# Patient Record
Sex: Female | Born: 1970 | Race: Black or African American | Hispanic: No | Marital: Married | Smoking: Never smoker
Health system: Southern US, Community
[De-identification: ages and names within clinical notes are randomized; demographics above are authoritative.]

## PROBLEM LIST (undated history)

## (undated) DIAGNOSIS — N182 Chronic kidney disease, stage 2 (mild): Secondary | ICD-10-CM

## (undated) DIAGNOSIS — O009 Unspecified ectopic pregnancy without intrauterine pregnancy: Secondary | ICD-10-CM

## (undated) DIAGNOSIS — F419 Anxiety disorder, unspecified: Secondary | ICD-10-CM

## (undated) DIAGNOSIS — S83252A Bucket-handle tear of lateral meniscus, current injury, left knee, initial encounter: Secondary | ICD-10-CM

## (undated) DIAGNOSIS — D219 Benign neoplasm of connective and other soft tissue, unspecified: Secondary | ICD-10-CM

## (undated) HISTORY — PX: MYOMECTOMY: SHX85

## (undated) HISTORY — DX: Anxiety disorder, unspecified: F41.9

---

## 1994-10-29 HISTORY — PX: TONSILLECTOMY AND ADENOIDECTOMY: SUR1326

## 2012-02-04 ENCOUNTER — Encounter: Payer: Self-pay | Admitting: Internal Medicine

## 2012-02-04 ENCOUNTER — Ambulatory Visit (INDEPENDENT_AMBULATORY_CARE_PROVIDER_SITE_OTHER): Payer: 59 | Admitting: Internal Medicine

## 2012-02-04 VITALS — BP 126/80 | HR 80 | Temp 97.9°F | Ht 64.0 in | Wt 178.0 lb

## 2012-02-04 DIAGNOSIS — F411 Generalized anxiety disorder: Secondary | ICD-10-CM

## 2012-02-04 DIAGNOSIS — E049 Nontoxic goiter, unspecified: Secondary | ICD-10-CM

## 2012-02-04 DIAGNOSIS — F419 Anxiety disorder, unspecified: Secondary | ICD-10-CM | POA: Insufficient documentation

## 2012-02-04 DIAGNOSIS — E010 Iodine-deficiency related diffuse (endemic) goiter: Secondary | ICD-10-CM

## 2012-02-04 HISTORY — DX: Anxiety disorder, unspecified: F41.9

## 2012-02-04 MED ORDER — CITALOPRAM HYDROBROMIDE 20 MG PO TABS: 20.0000 mg | ORAL_TABLET | Freq: Every day | ORAL | Status: DC

## 2012-02-04 NOTE — Assessment & Plan Note (Signed)
Mild thyromegaly on exam today, patient was never been told before. Plan: Recheck or return to the office, likely will need labs

## 2012-02-04 NOTE — Progress Notes (Signed)
  Subjective:    Patient ID: Theresa Kidd, female    DOB: 07-10-1971, 41 y.o.   MRN: [id number]  HPI New  patient Here to establish care, also going through a lot in the last few months, just moved to this area, working full time, getting a PhD. In the past she had anxiety and was well controlled with citalopram 10 mg daily. Wonders if that's a good option.   Past medical history Anxiety  Past surgical history tonsillectomy & adenoidectomy at age 42 G4P2 Not doing birth control   Social history Married, 2 children Moved from [place]  Occupation: Publishing rights manager , getting a PhD Works at the [facility] [employer] Tobacco-- no ETOH-- no  Family history Diabetes-- uncle CAD-- F MI at age 31 Stroke-- F  Several, first at age 20 Colon cancer-- no Breast cancer-- no Prostate cancer-- no   Review of Systems Very mild depression per se. Not sleeping well, she is worried about things but at the same time she is very busy and doesn't have enough time to rest. Fortunately, her 2 daughters are doing well and adaptoing quickly to the new environment.     Objective:   Physical Exam   General -- alert, well-developed, and well-nourished. NAD  Neck --no LADs, thyroid slightly enlarged?, Not tender, nodular. Gland is symmetric Lungs -- normal respiratory effort, no intercostal retractions, no accessory muscle use, and normal breath sounds.   Heart-- normal rate, regular rhythm, no murmur, and no gallop.   Extremities-- no pretibial edema bilaterally  Psych-- oriented x3, no apparent emotional distress     Assessment & Plan:   Last PAP 01-2011 Last MMG 2012 Never had a cscope  Gave her some options about a gynecologist, see instructions

## 2012-02-04 NOTE — Assessment & Plan Note (Addendum)
Anxiety with minimal depression. In the past she responded well to citalopram 10 mg. Patient is counseled briefly today. I recommend citalopram, to stay on 10 mg daily if that dose is enough otherwise she could go up to 20 mg. See instructions.

## 2012-02-04 NOTE — Patient Instructions (Signed)
Take half citalopram daily, increase to one of the needed. Please come back in 6 weeks for a checkup and call if you have side effects. ------------------------------ [facility]  [address] [place], [place] [address]  Location Hours:Monday - Friday: 7:45am - 5:00pm Telephone answered Monday - Friday: 8:00am - 5:00pm  Phone:[phone]  [phone]

## 2012-02-05 ENCOUNTER — Encounter: Payer: Self-pay | Admitting: Internal Medicine

## 2012-03-04 ENCOUNTER — Ambulatory Visit: Payer: Self-pay | Admitting: Family Medicine

## 2012-04-15 ENCOUNTER — Other Ambulatory Visit: Payer: Self-pay | Admitting: Internal Medicine

## 2012-04-15 NOTE — Telephone Encounter (Signed)
Refill done.  

## 2012-05-12 ENCOUNTER — Other Ambulatory Visit: Payer: Self-pay | Admitting: Internal Medicine

## 2012-06-06 ENCOUNTER — Other Ambulatory Visit: Payer: Self-pay | Admitting: Obstetrics and Gynecology

## 2012-06-06 DIAGNOSIS — Z1231 Encounter for screening mammogram for malignant neoplasm of breast: Secondary | ICD-10-CM

## 2012-06-19 ENCOUNTER — Ambulatory Visit: Payer: 59

## 2012-06-20 ENCOUNTER — Other Ambulatory Visit (HOSPITAL_COMMUNITY): Payer: Self-pay | Admitting: Obstetrics and Gynecology

## 2012-06-20 DIAGNOSIS — N96 Recurrent pregnancy loss: Secondary | ICD-10-CM

## 2012-06-26 ENCOUNTER — Ambulatory Visit (HOSPITAL_COMMUNITY)
Admission: RE | Admit: 2012-06-26 | Discharge: 2012-06-26 | Disposition: A | Discharge status: Home / Self Care | Payer: 59 | Source: Ambulatory Visit | Attending: Obstetrics and Gynecology | Admitting: Obstetrics and Gynecology

## 2012-06-26 DIAGNOSIS — N96 Recurrent pregnancy loss: Secondary | ICD-10-CM | POA: Insufficient documentation

## 2012-06-26 MED ORDER — IOHEXOL 300 MG/ML  SOLN
50.0000 mL | Freq: Once | INTRAMUSCULAR | Status: AC | PRN
  Administered 2012-06-26: 50 mL

## 2012-07-01 ENCOUNTER — Encounter (HOSPITAL_COMMUNITY): Payer: Self-pay | Admitting: Family Medicine

## 2012-07-01 ENCOUNTER — Emergency Department (HOSPITAL_COMMUNITY)
Admission: EM | Admit: 2012-07-01 | Discharge: 2012-07-02 | Disposition: A | Discharge status: Home / Self Care | Payer: 59 | Attending: Emergency Medicine | Admitting: Emergency Medicine

## 2012-07-01 ENCOUNTER — Emergency Department (HOSPITAL_COMMUNITY): Payer: 59

## 2012-07-01 ENCOUNTER — Ambulatory Visit
Admission: RE | Admit: 2012-07-01 | Discharge: 2012-07-01 | Disposition: A | Discharge status: Home / Self Care | Payer: 59 | Source: Ambulatory Visit | Attending: Obstetrics and Gynecology | Admitting: Obstetrics and Gynecology

## 2012-07-01 DIAGNOSIS — R109 Unspecified abdominal pain: Secondary | ICD-10-CM | POA: Insufficient documentation

## 2012-07-01 DIAGNOSIS — O99891 Other specified diseases and conditions complicating pregnancy: Secondary | ICD-10-CM | POA: Insufficient documentation

## 2012-07-01 DIAGNOSIS — Z1231 Encounter for screening mammogram for malignant neoplasm of breast: Secondary | ICD-10-CM

## 2012-07-01 DIAGNOSIS — N200 Calculus of kidney: Secondary | ICD-10-CM | POA: Insufficient documentation

## 2012-07-01 DIAGNOSIS — Z3490 Encounter for supervision of normal pregnancy, unspecified, unspecified trimester: Secondary | ICD-10-CM

## 2012-07-01 DIAGNOSIS — O210 Mild hyperemesis gravidarum: Secondary | ICD-10-CM | POA: Insufficient documentation

## 2012-07-01 HISTORY — DX: Benign neoplasm of connective and other soft tissue, unspecified: D21.9

## 2012-07-01 HISTORY — DX: Unspecified ectopic pregnancy without intrauterine pregnancy: O00.90

## 2012-07-01 LAB — URINALYSIS, ROUTINE W REFLEX MICROSCOPIC
Glucose, UA: NEGATIVE mg/dL
Ketones, ur: NEGATIVE mg/dL
Leukocytes, UA: NEGATIVE
Nitrite: NEGATIVE
Specific Gravity, Urine: 1.024 (ref 1.005–1.030)
pH: 6.5 (ref 5.0–8.0)

## 2012-07-01 LAB — PREGNANCY, URINE: Preg Test, Ur: POSITIVE — AB

## 2012-07-01 LAB — CBC WITH DIFFERENTIAL/PLATELET
Basophils Relative: 0 % (ref 0–1)
Eosinophils Absolute: 0.1 10*3/uL (ref 0.0–0.7)
Eosinophils Relative: 1 % (ref 0–5)
Lymphs Abs: 2.9 10*3/uL (ref 0.7–4.0)
MCH: 30.1 pg (ref 26.0–34.0)
MCHC: 34.5 g/dL (ref 30.0–36.0)
MCV: 87.1 fL (ref 78.0–100.0)
Neutrophils Relative %: 43 % (ref 43–77)
Platelets: 229 10*3/uL (ref 150–400)
RDW: 13.1 % (ref 11.5–15.5)

## 2012-07-01 LAB — COMPREHENSIVE METABOLIC PANEL
ALT: 13 U/L (ref 0–35)
AST: 17 U/L (ref 0–37)
Albumin: 4.1 g/dL (ref 3.5–5.2)
Alkaline Phosphatase: 65 U/L (ref 39–117)
CO2: 23 mEq/L (ref 19–32)
Chloride: 103 mEq/L (ref 96–112)
Creatinine, Ser: 0.91 mg/dL (ref 0.50–1.10)
GFR calc non Af Amer: 77 mL/min — ABNORMAL LOW (ref >90)
Potassium: 4 mEq/L (ref 3.5–5.1)
Sodium: 136 mEq/L (ref 135–145)
Total Bilirubin: 0.3 mg/dL (ref 0.3–1.2)

## 2012-07-01 LAB — HCG, QUANTITATIVE, PREGNANCY: hCG, Beta Chain, Quant, S: 15 m[IU]/mL — ABNORMAL HIGH (ref <5)

## 2012-07-01 MED ORDER — ACETAMINOPHEN 325 MG PO TABS
650.0000 mg | ORAL_TABLET | Freq: Once | ORAL | Status: AC
  Administered 2012-07-01: 650 mg via ORAL
  Filled 2012-07-01: qty 2

## 2012-07-01 MED ORDER — ONDANSETRON HCL 4 MG/2ML IJ SOLN
4.0000 mg | Freq: Once | INTRAMUSCULAR | Status: DC
  Filled 2012-07-01: qty 2

## 2012-07-01 NOTE — ED Notes (Signed)
Pt reports lower abdominal pain starting this morning and getting worse. Reports nausea no vomiting. Denies diarrhea or urinary sx.

## 2012-07-01 NOTE — ED Notes (Signed)
Pt states she started having left lower quadrant pain since this AM. No dysuria. No oliguria. Pt stated having 2 ectopic pregnancies in the past and wants confirmation this is not the same. Pain left lower quadrant level 8. Pain has gotten progressively worse over the day. Vital signs stable.

## 2012-07-02 NOTE — ED Provider Notes (Signed)
History     CSN: [id number]  Arrival date & time 07/01/12  4098   First MD Initiated Contact with Patient 07/01/12 2026      Chief Complaint  Patient presents with  . Flank Pain  . Nausea    (Consider location/radiation/quality/duration/timing/severity/associated sxs/prior treatment) HPI  Patient presents to the emergency department for left lower quadrant pain starting this morning. She denies having any diarrhea or vomiting but does admit to some nausea. She has a history of ectopic pregnancy and miscarriage. She had a  Historgram done on Jun 26, 2012 and does not believe herself to be pregnant. She has been having light vaginal bleeding since the procedure and denies todays bleeding being any different. She has a history of kidney stones and feels as though this feels the same. She is in no acute distress and declines pain medications at this time.  Past Medical History  Diagnosis Date  . Fibroids   . Ectopic pregnancy     Past Surgical History  Procedure Date  . Tonsillectomy and adenoidectomy 1996    Family History  Problem Relation Age of Onset  . Heart disease Mother   . Stroke Mother   . Hypertension Mother   . Heart disease Father   . Stroke Father   . Hypertension Father   . Diabetes Maternal Uncle     History  Substance Use Topics  . Smoking status: Never Smoker   . Smokeless tobacco: Never Used  . Alcohol Use: No    OB History    Grav Para Term Preterm Abortions TAB SAB Ect Mult Living                  Review of Systems   Review of Systems  Gen: no weight loss, fevers, chills, night sweats  Eyes: no discharge or drainage, no occular pain or visual changes  Nose: no epistaxis or rhinorrhea  Mouth: no dental pain, no sore throat  Neck: no neck pain  Lungs:No wheezing, coughing or hemoptysis CV: no chest pain, palpitations, dependent edema or orthopnea  Abd: + left flank pain, nausea, vomiting  GU: no dysuria or gross hematuria  MSK:  No  abnormalities  Neuro: no headache, no focal neurologic deficits  Skin: no abnormalities Psyche: negative.    Allergies  Review of patient's allergies indicates no known allergies.  Home Medications  No current outpatient prescriptions on file.  BP 112/61  Pulse 63  Temp 98.1 F (36.7 C) (Oral)  Resp 20  SpO2 99%  LMP 06/19/2012  Physical Exam  Nursing note and vitals reviewed. Constitutional: She appears well-developed and well-nourished. No distress.  HENT:  Head: Normocephalic and atraumatic.  Eyes: Pupils are equal, round, and reactive to light.  Neck: Normal range of motion. Neck supple.  Cardiovascular: Normal rate and regular rhythm.   Pulmonary/Chest: Effort normal.  Abdominal: Soft. She exhibits no distension. There is tenderness (flank pain) in the suprapubic area. There is no rebound and no guarding.  Neurological: She is alert.  Skin: Skin is warm and dry.    ED Course  Procedures (including critical care time)  Labs Reviewed  COMPREHENSIVE METABOLIC PANEL - Abnormal; Notable for the following:    GFR calc non Af Amer 77 (*)     GFR calc Af Amer 90 (*)     All other components within normal limits  PREGNANCY, URINE - Abnormal; Notable for the following:    Preg Test, Ur POSITIVE (*)     All  other components within normal limits  HCG, QUANTITATIVE, PREGNANCY - Abnormal; Notable for the following:    hCG, Beta Chain, Quant, S 15 (*)     All other components within normal limits  URINALYSIS, ROUTINE W REFLEX MICROSCOPIC  CBC WITH DIFFERENTIAL   Ct Abdomen Pelvis Wo Contrast  07/01/2012  *RADIOLOGY REPORT*  Clinical Data: Left flank and left lower quadrant pain question kidney stone  CT ABDOMEN AND PELVIS WITHOUT CONTRAST  Technique:  Multidetector CT imaging of the abdomen and pelvis was performed following the standard protocol without intravenous contrast. Sagittal and coronal MPR images reconstructed from axial data set.  Comparison: None  Findings: Lung  bases clear. Question tiny nonobstructing calculus upper pole left kidney. No hydronephrosis, ureteral dilatation or ureteral calcification. Bladder partially distended, unremarkable. 6 mm tiny low attenuation focus anteriorly within left lobe liver question tiny cyst. Within limits of a nonenhanced exam, no additional abnormalities of the liver, spleen, pancreas, kidneys, or adrenal glands. Normal appendix. Unremarkable uterus and adnexae. Stomach and bowel loops grossly unremarkable for technique. No mass, adenopathy, free fluid or inflammatory process. No acute osseous findings.  IMPRESSION: Questionable tiny nonobstructing left renal calculus. Otherwise negative exam.   Original Report Authenticated By: Lollie Marrow, M.D.    US Ob Comp Less 14 Wks  07/02/2012  *RADIOLOGY REPORT*  Clinical Data: First trimester pregnancy.  Left flank pain.  LMP 06/19/2012; beta HCG level 15.  History of ectopic pregnancy.  OBSTETRIC <14 WK [place] AND TRANSVAGINAL OB US  Technique:  Both transabdominal and transvaginal ultrasound examinations were performed for complete evaluation of the gestation as well as the maternal uterus, adnexal regions, and pelvic cul-de-sac.  Transvaginal technique was performed to assess early pregnancy.  Comparison:  Pelvic CT 07/01/2012  Intrauterine gestational sac:  None visualized. Yolk sac: None visualized. Embryo: None visualized.  Maternal uterus/adnexae: Both maternal ovaries are visualized and appear normal with small follicles bilaterally.  There is blood flow in both ovaries with color Doppler.  There is no adnexal mass or significant free pelvic fluid.  The myometrium is mildly heterogeneous with a possible small fundal fibroid.  IMPRESSION:  1.  No demonstrated intrauterine pregnancy. 2.  No suspicious adnexal findings or significant free pelvic fluid. 3.  These findings are nonspecific in light of the low beta HCG level and may be due to a normal early pregnancy. However, clinical  follow-up is necessary to exclude abnormal/ectopic pregnancy and to assess viability.   Original Report Authenticated By: Gerrianne Scale, M.D.    US Ob Transvaginal  07/02/2012  *RADIOLOGY REPORT*  Clinical Data: First trimester pregnancy.  Left flank pain.  LMP 06/19/2012; beta HCG level 15.  History of ectopic pregnancy.  OBSTETRIC <14 WK [place] AND TRANSVAGINAL OB US  Technique:  Both transabdominal and transvaginal ultrasound examinations were performed for complete evaluation of the gestation as well as the maternal uterus, adnexal regions, and pelvic cul-de-sac.  Transvaginal technique was performed to assess early pregnancy.  Comparison:  Pelvic CT 07/01/2012  Intrauterine gestational sac:  None visualized. Yolk sac: None visualized. Embryo: None visualized.  Maternal uterus/adnexae: Both maternal ovaries are visualized and appear normal with small follicles bilaterally.  There is blood flow in both ovaries with color Doppler.  There is no adnexal mass or significant free pelvic fluid.  The myometrium is mildly heterogeneous with a possible small fundal fibroid.  IMPRESSION:  1.  No demonstrated intrauterine pregnancy. 2.  No suspicious adnexal findings or significant free pelvic fluid.  3.  These findings are nonspecific in light of the low beta HCG level and may be due to a normal early pregnancy. However, clinical follow-up is necessary to exclude abnormal/ectopic pregnancy and to assess viability.   Original Report Authenticated By: Gerrianne Scale, M.D.      1. Kidney stone   2. Pregnant       MDM  Patient had a Hystogram yesterday and was not believed to be pregnant. Therefore a CT scan was ordered before the urine preg resulted and patient went to CT scan. She does have a positive pregnancy test, with a low beta HCG, and possibly a right kidney stone.  Dr. Bobbie Stack is her OB doctor. The patient is very reliable and will call tomorrow to let her know the news. Patient knows she is  very high risk and understands the importance of very very close follow-up.   Pt has been advised of the symptoms that warrant their return to the ED. Patient has voiced understanding and has agreed to follow-up with the PCP or specialist.         Dorthula Matas, PA 07/02/12 4782

## 2012-07-03 NOTE — ED Provider Notes (Signed)
Medical screening examination/treatment/procedure(s) were performed by non-physician practitioner and as supervising physician I was immediately available for consultation/collaboration.  I was alerted pt's positive pregnancy test after receiving a CT scan.  Agree with follow-up plan.  Tobin Chad, MD 07/03/12 0030

## 2012-08-23 ENCOUNTER — Other Ambulatory Visit: Payer: Self-pay | Admitting: Internal Medicine

## 2012-08-25 NOTE — Telephone Encounter (Signed)
Okay one month, needs office visit before next refill

## 2012-08-25 NOTE — Telephone Encounter (Signed)
Refill done.  

## 2012-08-25 NOTE — Telephone Encounter (Signed)
Ok to refill 

## 2012-09-11 ENCOUNTER — Encounter: Payer: Self-pay | Admitting: Internal Medicine

## 2012-09-15 ENCOUNTER — Ambulatory Visit (INDEPENDENT_AMBULATORY_CARE_PROVIDER_SITE_OTHER): Payer: 59 | Admitting: Internal Medicine

## 2012-09-15 ENCOUNTER — Encounter: Payer: Self-pay | Admitting: Internal Medicine

## 2012-09-15 VITALS — BP 128/84 | HR 81 | Temp 98.0°F | Wt 193.0 lb

## 2012-09-15 DIAGNOSIS — E010 Iodine-deficiency related diffuse (endemic) goiter: Secondary | ICD-10-CM

## 2012-09-15 DIAGNOSIS — L0292 Furuncle, unspecified: Secondary | ICD-10-CM | POA: Insufficient documentation

## 2012-09-15 DIAGNOSIS — L0293 Carbuncle, unspecified: Secondary | ICD-10-CM

## 2012-09-15 DIAGNOSIS — E049 Nontoxic goiter, unspecified: Secondary | ICD-10-CM

## 2012-09-15 MED ORDER — DOXYCYCLINE HYCLATE 100 MG PO TABS: 100.0000 mg | ORAL_TABLET | Freq: Two times a day (BID) | ORAL | Status: DC

## 2012-09-15 NOTE — Patient Instructions (Addendum)
We are going to schedule a thyroid ultrasound and also surgical referral.

## 2012-09-15 NOTE — Assessment & Plan Note (Signed)
Tender, soft mass at the right armpit. Likely a cyst or boil. Plan: Doxycycline Surgical referral for consideration of excision versus  I&D

## 2012-09-15 NOTE — Assessment & Plan Note (Signed)
Check TFTs, thyroid ultrasound

## 2012-09-15 NOTE — Progress Notes (Signed)
  Subjective:    Patient ID: Theresa Kidd, female    DOB: 1971/04/24, 41 y.o.   MRN: [id number]  HPI Acute visit, we discussed the following 2 months ago noted a lump in the right armpit, it hurts a little, is worse when she lifts her arm. She had a mammogram 07/01/2012 and he was normal, self breast exam is normal.  She also liked to discuss thyromegaly.  Past Medical History  Diagnosis Date  . Fibroids   . Ectopic pregnancy   . Thyromegaly 02/04/2012  . Anxiety 02/04/2012   Past Surgical History  Procedure Date  . Tonsillectomy and adenoidectomy 1996     Review of Systems No weight loss, some weight gain. No discharge from the armpit, no fever She reports some heat intolerance and -what seems to her- excessive sweating.     Objective:   Physical Exam General -- alert, well-developed, and overweight appearing. No apparent distress.   breast-- no dominant mass Right armpit, has a 4x1 cm mass, slightly fluctuant at one of the sides but otherwise soft and slightly tender. Not warm , no openings or d/c noted  Psych-- Cognition and judgment appear intact. Alert and cooperative with normal attention span and concentration.  not anxious appearing and not depressed appearing.       Assessment & Plan:  Gyn-- Dr cousins

## 2012-09-16 LAB — T4, FREE: Free T4: 0.85 ng/dL (ref 0.60–1.60)

## 2012-09-18 ENCOUNTER — Ambulatory Visit (INDEPENDENT_AMBULATORY_CARE_PROVIDER_SITE_OTHER): Payer: Commercial Managed Care - PPO | Admitting: General Surgery

## 2012-09-18 ENCOUNTER — Encounter (INDEPENDENT_AMBULATORY_CARE_PROVIDER_SITE_OTHER): Payer: Self-pay | Admitting: General Surgery

## 2012-09-18 VITALS — BP 132/88 | HR 84 | Temp 98.1°F | Resp 16 | Ht 63.0 in | Wt 194.4 lb

## 2012-09-18 DIAGNOSIS — R2230 Localized swelling, mass and lump, unspecified upper limb: Secondary | ICD-10-CM

## 2012-09-18 DIAGNOSIS — R229 Localized swelling, mass and lump, unspecified: Secondary | ICD-10-CM

## 2012-09-18 NOTE — Progress Notes (Signed)
Patient ID: Theresa Kidd, female   DOB: 12/18/1970, 41 y.o.   MRN: [id number]  Chief Complaint  Patient presents with  . Recurrent Skin Infections    new pt- eval boil under axilla    HPI Theresa Kidd is a 41 y.o. female.  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 She presents with a two-month history of a right axillary mass. This area is been getting larger. It has not been draining or really had any infection if she notes at all. She comes in today because it has been getting larger is begun to bother her due to the size. She's been up-to-date on her mammograms and reports no abnormal mammograms right breast complaints in the past. Past Medical History  Diagnosis Date  . Fibroids   . Ectopic pregnancy   . Thyromegaly 02/04/2012  . Anxiety 02/04/2012    Past Surgical History  Procedure Date  . Tonsillectomy and adenoidectomy 1996  . Myomectomy     Family History  Problem Relation Age of Onset  . Heart disease Mother   . Stroke Mother   . Hypertension Mother   . Heart disease Father   . Stroke Father   . Hypertension Father   . Diabetes Maternal Uncle     Social History History  Substance Use Topics  . Smoking status: Never Smoker   . Smokeless tobacco: Never Used  . Alcohol Use: No    No Known Allergies  Current Outpatient Prescriptions  Medication Sig Dispense Refill  . escitalopram (LEXAPRO) 20 MG tablet Take 20 mg by mouth daily.        Review of Systems Review of Systems  Constitutional: Negative for fever, chills and unexpected weight change.  HENT: Negative for hearing loss, congestion, sore throat, trouble swallowing and voice change.   Eyes: Negative for visual disturbance.  Respiratory: Negative for cough and wheezing.   Cardiovascular: Negative for chest pain, palpitations and leg swelling.  Gastrointestinal: Negative for nausea, vomiting, abdominal pain, diarrhea, constipation, blood in  stool, abdominal distention and anal bleeding.  Genitourinary: Negative for hematuria, vaginal bleeding and difficulty urinating.  Musculoskeletal: Negative for arthralgias.  Skin: Negative for rash and wound.  Neurological: Negative for seizures, syncope and headaches.  Hematological: Negative for adenopathy. Does not bruise/bleed easily.  Psychiatric/Behavioral: Negative for confusion.    Blood pressure 132/88, pulse 84, temperature 98.1 F (36.7 C), temperature source Temporal, resp. rate 16, height 5' 3" (1.6 m), weight 194 lb 6.4 oz (88.179 kg).  Physical Exam Physical Exam  Vitals reviewed. Constitutional: She appears well-developed and well-nourished.  Cardiovascular: Normal rate, regular rhythm and normal heart sounds.   Pulmonary/Chest: Effort normal and breath sounds normal. She has no wheezes. She has no rales. She exhibits mass.      Data Reviewed Note from Dr. Paz  Assessment    Right axillary mass    Plan    I think this is a benign lesion. We did discuss removal due to symptoms as well as to ensure that that is the case though. I discussed a right axillary mass excision under general anesthesia and the risks and complications associated with that. We'll proceed soon.       Theresa Kidd 09/18/2012, 2:56 PM    

## 2012-09-19 ENCOUNTER — Ambulatory Visit
Admission: RE | Admit: 2012-09-19 | Discharge: 2012-09-19 | Disposition: A | Discharge status: Home / Self Care | Payer: 59 | Source: Ambulatory Visit | Attending: Internal Medicine | Admitting: Internal Medicine

## 2012-09-19 DIAGNOSIS — E010 Iodine-deficiency related diffuse (endemic) goiter: Secondary | ICD-10-CM

## 2012-09-24 ENCOUNTER — Encounter: Payer: Self-pay | Admitting: *Deleted

## 2012-09-28 ENCOUNTER — Encounter: Payer: Self-pay | Admitting: Internal Medicine

## 2012-09-29 ENCOUNTER — Telehealth: Payer: Self-pay | Admitting: Internal Medicine

## 2012-09-29 DIAGNOSIS — L509 Urticaria, unspecified: Secondary | ICD-10-CM

## 2012-09-29 NOTE — Progress Notes (Signed)
No labs needed

## 2012-09-29 NOTE — Telephone Encounter (Signed)
Patient send a referral in reference to an allergist referral, they usually prefers the PCP to see the patient first however if she gets me enough details I may be able to just refer her; please call the patient and get some details

## 2012-09-29 NOTE — Telephone Encounter (Signed)
Spoke with pt. She states that she gets hives on her inner thighs & forearms. Pt states that with the rash she also has a burning sensation. Pt states that the rash & burning usually lasts about each time and this happens 3 to 4 times a week. When the pt notices the rash she usually apply's topical benadryl & the rash ill disappear within . Pt states she doesn't have any SOB. Please advise.

## 2012-09-30 NOTE — Telephone Encounter (Signed)
Discussed with pt, entered orders.

## 2012-09-30 NOTE — Telephone Encounter (Signed)
Is severe symptoms, lip or facial swelling -- needs to go to the ER. Start OTC Claritin 10 mg daily Arrange a referral to see  Dr. Maple Hudson (our allergist)

## 2012-10-01 ENCOUNTER — Encounter (HOSPITAL_BASED_OUTPATIENT_CLINIC_OR_DEPARTMENT_OTHER): Payer: Self-pay | Admitting: Certified Registered Nurse Anesthetist

## 2012-10-01 ENCOUNTER — Ambulatory Visit (HOSPITAL_BASED_OUTPATIENT_CLINIC_OR_DEPARTMENT_OTHER): Payer: 59 | Admitting: Certified Registered Nurse Anesthetist

## 2012-10-01 ENCOUNTER — Telehealth (INDEPENDENT_AMBULATORY_CARE_PROVIDER_SITE_OTHER): Payer: Self-pay | Admitting: General Surgery

## 2012-10-01 ENCOUNTER — Ambulatory Visit (HOSPITAL_BASED_OUTPATIENT_CLINIC_OR_DEPARTMENT_OTHER)
Admission: RE | Admit: 2012-10-01 | Discharge: 2012-10-01 | Disposition: A | Discharge status: Home / Self Care | Payer: 59 | Source: Ambulatory Visit | Attending: General Surgery | Admitting: General Surgery

## 2012-10-01 ENCOUNTER — Encounter (HOSPITAL_BASED_OUTPATIENT_CLINIC_OR_DEPARTMENT_OTHER): Payer: Self-pay | Admitting: *Deleted

## 2012-10-01 ENCOUNTER — Encounter (HOSPITAL_BASED_OUTPATIENT_CLINIC_OR_DEPARTMENT_OTHER)
Admission: RE | Disposition: A | Discharge status: Home / Self Care | Payer: Self-pay | Source: Ambulatory Visit | Attending: General Surgery

## 2012-10-01 DIAGNOSIS — L723 Sebaceous cyst: Secondary | ICD-10-CM | POA: Insufficient documentation

## 2012-10-01 DIAGNOSIS — IMO0002 Reserved for concepts with insufficient information to code with codable children: Secondary | ICD-10-CM

## 2012-10-01 HISTORY — PX: MASS EXCISION: SHX2000

## 2012-10-01 SURGERY — EXCISION MASS
Anesthesia: General | Site: Axilla | Laterality: Right | Wound class: Dirty or Infected

## 2012-10-01 MED ORDER — CEFAZOLIN SODIUM-DEXTROSE 2-3 GM-% IV SOLR
2.0000 g | INTRAVENOUS | Status: AC
  Administered 2012-10-01: 2 g via INTRAVENOUS

## 2012-10-01 MED ORDER — BUPIVACAINE HCL (PF) 0.25 % IJ SOLN
INTRAMUSCULAR | Status: DC | PRN
  Administered 2012-10-01: 10 mL

## 2012-10-01 MED ORDER — HYDROCODONE-ACETAMINOPHEN 10-325 MG PO TABS: 1.0000 | ORAL_TABLET | Freq: Four times a day (QID) | ORAL | Status: DC | PRN

## 2012-10-01 MED ORDER — FENTANYL CITRATE 0.05 MG/ML IJ SOLN
INTRAMUSCULAR | Status: DC | PRN
  Administered 2012-10-01 (×3): 25 ug via INTRAVENOUS
  Administered 2012-10-01: 50 ug via INTRAVENOUS

## 2012-10-01 MED ORDER — OXYCODONE HCL 5 MG PO TABS
5.0000 mg | ORAL_TABLET | Freq: Once | ORAL | Status: AC | PRN
  Administered 2012-10-01: 5 mg via ORAL

## 2012-10-01 MED ORDER — LACTATED RINGERS IV SOLN
INTRAVENOUS | Status: DC
  Administered 2012-10-01 (×2): via INTRAVENOUS

## 2012-10-01 MED ORDER — 0.9 % SODIUM CHLORIDE (POUR BTL) OPTIME
TOPICAL | Status: DC | PRN
  Administered 2012-10-01: 1000 mL

## 2012-10-01 MED ORDER — LIDOCAINE HCL (CARDIAC) 20 MG/ML IV SOLN
INTRAVENOUS | Status: DC | PRN
  Administered 2012-10-01: 60 mg via INTRAVENOUS

## 2012-10-01 MED ORDER — OXYCODONE HCL 5 MG/5ML PO SOLN: 5.0000 mg | Freq: Once | ORAL | Status: AC | PRN

## 2012-10-01 MED ORDER — MIDAZOLAM HCL 5 MG/5ML IJ SOLN
INTRAMUSCULAR | Status: DC | PRN
  Administered 2012-10-01: 1 mg via INTRAVENOUS

## 2012-10-01 MED ORDER — DEXAMETHASONE SODIUM PHOSPHATE 4 MG/ML IJ SOLN
INTRAMUSCULAR | Status: DC | PRN
  Administered 2012-10-01: 10 mg via INTRAVENOUS

## 2012-10-01 MED ORDER — PROPOFOL 10 MG/ML IV BOLUS
INTRAVENOUS | Status: DC | PRN
  Administered 2012-10-01: 200 mg via INTRAVENOUS

## 2012-10-01 MED ORDER — HYDROMORPHONE HCL PF 1 MG/ML IJ SOLN
0.2500 mg | INTRAMUSCULAR | Status: DC | PRN
  Administered 2012-10-01 (×3): 0.5 mg via INTRAVENOUS

## 2012-10-01 SURGICAL SUPPLY — 46 items
BLADE SURG 15 STRL LF DISP TIS (BLADE) ×1 IMPLANT
BLADE SURG 15 STRL SS (BLADE) ×1
BLADE SURG ROTATE 9660 (MISCELLANEOUS) ×2 IMPLANT
CANISTER SUCTION 1200CC (MISCELLANEOUS) ×2 IMPLANT
CHLORAPREP W/TINT 26ML (MISCELLANEOUS) ×2 IMPLANT
CLOTH BEACON ORANGE TIMEOUT ST (SAFETY) ×2 IMPLANT
COVER MAYO STAND STRL (DRAPES) ×2 IMPLANT
COVER TABLE BACK 60X90 (DRAPES) ×2 IMPLANT
DECANTER SPIKE VIAL GLASS SM (MISCELLANEOUS) IMPLANT
DERMABOND ADVANCED (GAUZE/BANDAGES/DRESSINGS) ×1
DERMABOND ADVANCED .7 DNX12 (GAUZE/BANDAGES/DRESSINGS) ×1 IMPLANT
DRAIN PENROSE 1/2X12 LTX STRL (WOUND CARE) ×2 IMPLANT
DRAPE PED LAPAROTOMY (DRAPES) ×2 IMPLANT
DRSG TEGADERM 4X4.75 (GAUZE/BANDAGES/DRESSINGS) IMPLANT
ELECT COATED BLADE 2.86 ST (ELECTRODE) ×2 IMPLANT
ELECT REM PT RETURN 9FT ADLT (ELECTROSURGICAL) ×2
ELECTRODE REM PT RTRN 9FT ADLT (ELECTROSURGICAL) ×1 IMPLANT
GAUZE PACKING IODOFORM 1/4X5 (PACKING) IMPLANT
GAUZE SPONGE 4X4 12PLY STRL LF (GAUZE/BANDAGES/DRESSINGS) IMPLANT
GLOVE BIO SURGEON STRL SZ7 (GLOVE) ×2 IMPLANT
GLOVE BIOGEL PI IND STRL 7.5 (GLOVE) ×1 IMPLANT
GLOVE BIOGEL PI INDICATOR 7.5 (GLOVE) ×1
GOWN PREVENTION PLUS XLARGE (GOWN DISPOSABLE) ×4 IMPLANT
NEEDLE HYPO 25X1 1.5 SAFETY (NEEDLE) ×2 IMPLANT
NS IRRIG 1000ML POUR BTL (IV SOLUTION) ×2 IMPLANT
PACK BASIN DAY SURGERY FS (CUSTOM PROCEDURE TRAY) ×2 IMPLANT
PENCIL BUTTON HOLSTER BLD 10FT (ELECTRODE) ×2 IMPLANT
STAPLER VISISTAT 35W (STAPLE) ×2 IMPLANT
SUT ETHILON 2 0 FS 18 (SUTURE) ×2 IMPLANT
SUT MNCRL AB 4-0 PS2 18 (SUTURE) ×2 IMPLANT
SUT SILK 2 0 SH (SUTURE) IMPLANT
SUT VIC AB 2-0 SH 27 (SUTURE) ×2
SUT VIC AB 2-0 SH 27XBRD (SUTURE) ×2 IMPLANT
SUT VIC AB 3-0 SH 27 (SUTURE) ×1
SUT VIC AB 3-0 SH 27X BRD (SUTURE) ×1 IMPLANT
SUT VICRYL 3-0 CR8 SH (SUTURE) IMPLANT
SUT VICRYL 4-0 PS2 18IN ABS (SUTURE) IMPLANT
SWAB COLLECTION DEVICE MRSA (MISCELLANEOUS) IMPLANT
SYR CONTROL 10ML LL (SYRINGE) ×2 IMPLANT
TOWEL OR 17X24 6PK STRL BLUE (TOWEL DISPOSABLE) ×4 IMPLANT
TOWEL OR NON WOVEN STRL DISP B (DISPOSABLE) ×2 IMPLANT
TUBE ANAEROBIC SPECIMEN COL (MISCELLANEOUS) IMPLANT
TUBE CONNECTING 20X1/4 (TUBING) ×2 IMPLANT
UNDERPAD 30X30 INCONTINENT (UNDERPADS AND DIAPERS) IMPLANT
WATER STERILE IRR 1000ML POUR (IV SOLUTION) IMPLANT
YANKAUER SUCT BULB TIP NO VENT (SUCTIONS) ×2 IMPLANT

## 2012-10-01 NOTE — Anesthesia Postprocedure Evaluation (Signed)
  Anesthesia Post-op Note  Patient: Theresa Kidd  Procedure(s) Performed: Procedure(s) (LRB) with comments: EXCISION MASS (Right) - Right axillary mass excision  Patient Location: PACU  Anesthesia Type:General  Level of Consciousness: awake, alert  and oriented  Airway and Oxygen Therapy: Patient Spontanous Breathing  Post-op Pain: mild  Post-op Assessment: Post-op Vital signs reviewed, Patient's Cardiovascular Status Stable, Respiratory Function Stable, Patent Airway and No signs of Nausea or vomiting  Post-op Vital Signs: Reviewed and stable  Complications: No apparent anesthesia complications

## 2012-10-01 NOTE — H&P (View-Only) (Signed)
Patient ID: Theresa Kidd, female   DOB: 06-16-1971, 41 y.o.   MRN: [id number]  Chief Complaint  Patient presents with  . Recurrent Skin Infections    new pt- eval boil under axilla    HPI Theresa Kidd is a 41 y.o. female.  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 She presents with a two-month history of a right axillary mass. This area is been getting larger. It has not been draining or really had any infection if she notes at all. She comes in today because it has been getting larger is begun to bother her due to the size. She's been up-to-date on her mammograms and reports no abnormal mammograms right breast complaints in the past. Past Medical History  Diagnosis Date  . Fibroids   . Ectopic pregnancy   . Thyromegaly 02/04/2012  . Anxiety 02/04/2012    Past Surgical History  Procedure Date  . Tonsillectomy and adenoidectomy 1996  . Myomectomy     Family History  Problem Relation Age of Onset  . Heart disease Mother   . Stroke Mother   . Hypertension Mother   . Heart disease Father   . Stroke Father   . Hypertension Father   . Diabetes Maternal Uncle     Social History History  Substance Use Topics  . Smoking status: Never Smoker   . Smokeless tobacco: Never Used  . Alcohol Use: No    No Known Allergies  Current Outpatient Prescriptions  Medication Sig Dispense Refill  . escitalopram (LEXAPRO) 20 MG tablet Take 20 mg by mouth daily.        Review of Systems Review of Systems  Constitutional: Negative for fever, chills and unexpected weight change.  HENT: Negative for hearing loss, congestion, sore throat, trouble swallowing and voice change.   Eyes: Negative for visual disturbance.  Respiratory: Negative for cough and wheezing.   Cardiovascular: Negative for chest pain, palpitations and leg swelling.  Gastrointestinal: Negative for nausea, vomiting, abdominal pain, diarrhea, constipation, blood in  stool, abdominal distention and anal bleeding.  Genitourinary: Negative for hematuria, vaginal bleeding and difficulty urinating.  Musculoskeletal: Negative for arthralgias.  Skin: Negative for rash and wound.  Neurological: Negative for seizures, syncope and headaches.  Hematological: Negative for adenopathy. Does not bruise/bleed easily.  Psychiatric/Behavioral: Negative for confusion.    Blood pressure 132/88, pulse 84, temperature 98.1 F (36.7 C), temperature source Temporal, resp. rate 16, height 5\' 3"  (1.6 m), weight 194 lb 6.4 oz (88.179 kg).  Physical Exam Physical Exam  Vitals reviewed. Constitutional: She appears well-developed and well-nourished.  Cardiovascular: Normal rate, regular rhythm and normal heart sounds.   Pulmonary/Chest: Effort normal and breath sounds normal. She has no wheezes. She has no rales. She exhibits mass.      Data Reviewed Note from Dr. Drue Novel  Assessment    Right axillary mass    Plan    I think this is a benign lesion. We did discuss removal due to symptoms as well as to ensure that that is the case though. I discussed a right axillary mass excision under general anesthesia and the risks and complications associated with that. We'll proceed soon.       Jermarion Poffenberger 09/18/2012, 2:56 PM

## 2012-10-01 NOTE — Transfer of Care (Signed)
Immediate Anesthesia Transfer of Care Note  Patient: Theresa Kidd  Procedure(s) Performed: Procedure(s) (LRB) with comments: EXCISION MASS (Right) - Right axillary mass excision  Patient Location: PACU  Anesthesia Type:General  Level of Consciousness: awake and patient cooperative  Airway & Oxygen Therapy: Patient Spontanous Breathing and Patient connected to face mask oxygen  Post-op Assessment: Report given to PACU RN and Post -op Vital signs reviewed and stable  Post vital signs: Reviewed and stable  Complications: No apparent anesthesia complications

## 2012-10-01 NOTE — Anesthesia Preprocedure Evaluation (Signed)
Anesthesia Evaluation  Patient identified by MRN, date of birth, ID band Patient awake    Reviewed: Allergy & Precautions, H&P , NPO status , Patient's Chart, lab work & pertinent test results  Airway Mallampati: II TM Distance: >3 FB Neck ROM: Full    Dental No notable dental hx. (+) Teeth Intact and Dental Advisory Given   Pulmonary neg pulmonary ROS,  breath sounds clear to auscultation  Pulmonary exam normal       Cardiovascular negative cardio ROS  Rhythm:Regular Rate:Normal     Neuro/Psych negative neurological ROS  negative psych ROS   GI/Hepatic negative GI ROS, Neg liver ROS,   Endo/Other  negative endocrine ROS  Renal/GU negative Renal ROS  negative genitourinary   Musculoskeletal   Abdominal   Peds  Hematology negative hematology ROS (+)   Anesthesia Other Findings   Reproductive/Obstetrics negative OB ROS                           Anesthesia Physical Anesthesia Plan  ASA: II  Anesthesia Plan: General   Post-op Pain Management:    Induction: Intravenous  Airway Management Planned: LMA  Additional Equipment:   Intra-op Plan:   Post-operative Plan: Extubation in OR  Informed Consent: I have reviewed the patients History and Physical, chart, labs and discussed the procedure including the risks, benefits and alternatives for the proposed anesthesia with the patient or authorized representative who has indicated his/her understanding and acceptance.   Dental advisory given  Plan Discussed with: CRNA and Surgeon  Anesthesia Plan Comments:         Anesthesia Quick Evaluation

## 2012-10-01 NOTE — Anesthesia Procedure Notes (Signed)
Procedure Name: LMA Insertion Date/Time: 10/01/2012 2:18 PM Performed by: Kali Ambler D Pre-anesthesia Checklist: Patient identified, Emergency Drugs available, Suction available and Patient being monitored Patient Re-evaluated:Patient Re-evaluated prior to inductionOxygen Delivery Method: Circle System Utilized Preoxygenation: Pre-oxygenation with 100% oxygen Intubation Type: IV induction Ventilation: Mask ventilation without difficulty LMA: LMA inserted LMA Size: 4.0 Number of attempts: 1 Airway Equipment and Method: bite block Placement Confirmation: positive ETCO2 Tube secured with: Tape Dental Injury: Teeth and Oropharynx as per pre-operative assessment

## 2012-10-01 NOTE — Op Note (Signed)
Preoperative diagnosis: Right axillary mass Postoperative diagnosis: Right axillary sebaceous cyst, infected Procedure: Excision of right axillary mass Surgeon: Dr. Harden Mo Anesthesia: Gen. With LMA Specimens: Right axillary mass to pathology Drains: Penrose drain to axillary space Complications: None Estimated blood loss: Minimal Sponge needle count correct at end of operation Disposition to recovery in stable condition  Indications: This is a 41 year old female otherwise healthy who presented with a right axillary masses become painful. This appeared to be a sebaceous cyst on my examination. It did not appear to be infected when I saw her in the office. We discussed excising this area.  Procedure: After informed consent was obtained the patient was taken to the operating room. She was given 2 g of intravenous cefazolin. Sequential compression devices were placed on her legs. She was placed under general anesthesia with an LMA. Her right axilla was prepped and draped in the standard sterile surgical fashion. Surgical timeout was performed.  An elliptical incision was made surrounding the entire mass. This very clearly was a sebaceous cyst and there was a cavity that did contain purulence. I excised this in total all the way down to her fascia. There was no further sebaceous cyst or any infection present. The infection that was present was localized in the actual cyst itself. This was passed off the table as a specimen. I did close the deep layers with 2-0 Vicryl. I did place a Penrose drain in the deep space and this was exiting the lateral portion of the wound. I secured this with a 2-0 nylon. I then closed the skin with interrupted 2-0 nylon sutures. Bacitracin and structures were placed. She tolerated this well was extubated and transferred recovery stable.

## 2012-10-01 NOTE — Telephone Encounter (Signed)
LMOM letting pt know that she has a PO appt on 129 at 9:40.

## 2012-10-01 NOTE — Interval H&P Note (Signed)
History and Physical Interval Note:  10/01/2012 1:54 PM  Theresa Kidd  has presented today for surgery, with the diagnosis of right axillary mass  The various methods of treatment have been discussed with the patient and family. After consideration of risks, benefits and other options for treatment, the patient has consented to  Procedure(s) (LRB) with comments: EXCISION MASS (Right) - Right axillary mass excision as a surgical intervention .  The patient's history has been reviewed, patient examined, no change in status, stable for surgery.  I have reviewed the patient's chart and labs.  Questions were answered to the patient's satisfaction.     Talen Poser

## 2012-10-02 ENCOUNTER — Ambulatory Visit: Payer: 59 | Admitting: Family Medicine

## 2012-10-02 ENCOUNTER — Encounter (HOSPITAL_BASED_OUTPATIENT_CLINIC_OR_DEPARTMENT_OTHER): Payer: Self-pay | Admitting: General Surgery

## 2012-10-03 ENCOUNTER — Telehealth (INDEPENDENT_AMBULATORY_CARE_PROVIDER_SITE_OTHER): Payer: Self-pay | Admitting: General Surgery

## 2012-10-03 NOTE — Telephone Encounter (Signed)
Pt called to report that she had surgery on Wednesday with Dr. Dwain Sarna mass of axilla/ Her question was re numbness under arm and some down inner arm to elbow. She also said she noticed some numbness/tingling in 2 of her fingers . I advised her that some numbness is experienced early on after surgery due to cutting through skin nerves, and most of it should resolve over time. I told her Dr. Dwain Sarna or assistant would contact her if they had further concerns/[phone]

## 2012-10-06 ENCOUNTER — Encounter (INDEPENDENT_AMBULATORY_CARE_PROVIDER_SITE_OTHER): Payer: Self-pay | Admitting: General Surgery

## 2012-10-06 ENCOUNTER — Ambulatory Visit (INDEPENDENT_AMBULATORY_CARE_PROVIDER_SITE_OTHER): Payer: Commercial Managed Care - PPO | Admitting: General Surgery

## 2012-10-06 VITALS — BP 128/90 | HR 80 | Temp 97.8°F | Ht 64.0 in | Wt 193.2 lb

## 2012-10-06 DIAGNOSIS — Z09 Encounter for follow-up examination after completed treatment for conditions other than malignant neoplasm: Secondary | ICD-10-CM

## 2012-10-06 NOTE — Progress Notes (Signed)
Subjective:     Patient ID: Theresa Kidd, female   DOB: 23-Dec-1970, 40 y.o.   MRN: [id number]  HPI This is a 41 year old female who had a right axillary mass that I excised last week. This ended up being a right axillary likely ruptured epidermal inclusion cyst with a abscess associated with it. I excised an extensive area in her axilla. I closed this externally and then placed a Penrose drain. Now she has numbness on the inner aspect of her right arm going down to her 3 fingers on her right arm. She also has some pain at the site of surgery but this is not too significant. The dressing as needed to be changed 3-4 times per day with a large volume of serous fluid on it. She was back in today for followup.  Review of Systems     Objective:   Physical Exam Healing incision without infection, stitches and penrose in place    Assessment:     S/p right axillary abscess/mass excision    Plan:     I think the numbness she feels is primarily from position at the time of surgery this should likely get better with some time. I would leave the Penrose in place today. I will plan on seeing her back on Friday unless this is still draining significantly.

## 2012-10-10 ENCOUNTER — Encounter (INDEPENDENT_AMBULATORY_CARE_PROVIDER_SITE_OTHER): Payer: Commercial Managed Care - PPO | Admitting: General Surgery

## 2012-10-14 ENCOUNTER — Encounter (INDEPENDENT_AMBULATORY_CARE_PROVIDER_SITE_OTHER): Payer: Self-pay | Admitting: General Surgery

## 2012-10-14 ENCOUNTER — Ambulatory Visit (INDEPENDENT_AMBULATORY_CARE_PROVIDER_SITE_OTHER): Payer: Commercial Managed Care - PPO | Admitting: General Surgery

## 2012-10-14 VITALS — BP 130/72 | HR 74 | Temp 97.8°F | Resp 16 | Ht 64.0 in | Wt 193.1 lb

## 2012-10-14 DIAGNOSIS — Z09 Encounter for follow-up examination after completed treatment for conditions other than malignant neoplasm: Secondary | ICD-10-CM

## 2012-10-14 NOTE — Progress Notes (Signed)
Subjective:     Patient ID: Theresa Kidd, female   DOB: [birth year], 41 y.o.   MRN: [id number]  HPI This is a 41 year old female I excised a large axillary mass which ended up being a large infected area. She returns today to have her drain removed. She still has some numbness going down her right arm as well as in her axilla. There is still some foul-smelling drainage coming out. She otherwise is doing well without any real other complaints.  Review of Systems     Objective:   Physical Exam Healing incision without any infection, drain is in place and the drainage is mostly serous right now    Assessment:     Status post excision of infected right axillary mass    Plan:     I removed the Penrose drain today. I think the rest of this will heal by secondary intention. I'm going to have her return on Monday to remove her sutures. I think that the remainder of her symptoms with the numbness in the arm and down her arm will get better over some time and we will just follow that for right. I gave her some exercises to start moving her shoulder today as well.

## 2012-10-20 ENCOUNTER — Encounter (INDEPENDENT_AMBULATORY_CARE_PROVIDER_SITE_OTHER): Payer: Commercial Managed Care - PPO | Admitting: General Surgery

## 2012-10-24 ENCOUNTER — Ambulatory Visit (INDEPENDENT_AMBULATORY_CARE_PROVIDER_SITE_OTHER): Payer: Commercial Managed Care - PPO | Admitting: General Surgery

## 2012-10-24 DIAGNOSIS — Z4802 Encounter for removal of sutures: Secondary | ICD-10-CM

## 2012-10-24 NOTE — Patient Instructions (Signed)
Keep appt with Dr Dwain Sarna

## 2012-10-24 NOTE — Progress Notes (Signed)
Patient comes in for suture removal in right axillary region. Sutures were removed without difficulty. Patient tolerated well. Area still open on edge of wound at previous drain site but drainage is minimal. Patient instructed to keep area covered and follow up made for patient in two weeks with Dr Dwain Sarna.

## 2012-11-05 ENCOUNTER — Other Ambulatory Visit: Payer: Self-pay | Admitting: Internal Medicine

## 2012-11-05 NOTE — Telephone Encounter (Signed)
Refill lexpro per mychart message below  Appointment Request From: [name]      With Provider: Willow Ora, MD [-Primary Care Physician-]      Preferred Date Range: Any date 11/04/2012 or later      Preferred Times: Any      Reason for visit: Office Visit      Comments:   I would like an RX refill for [facility] to [facility]. Thank you

## 2012-11-05 NOTE — Telephone Encounter (Signed)
Ok to refill lexapro 20mg . Last OV 11.18.13.

## 2012-11-05 NOTE — Telephone Encounter (Signed)
Okay to refill a four-month supply. Please schedule a routine visit 01/2013.

## 2012-11-06 MED ORDER — ESCITALOPRAM OXALATE 20 MG PO TABS: 20.0000 mg | ORAL_TABLET | Freq: Every day | ORAL | Status: DC

## 2012-11-06 NOTE — Telephone Encounter (Signed)
Refill done.  

## 2012-11-07 ENCOUNTER — Encounter (INDEPENDENT_AMBULATORY_CARE_PROVIDER_SITE_OTHER): Payer: Commercial Managed Care - PPO | Admitting: General Surgery

## 2012-11-12 ENCOUNTER — Institutional Professional Consult (permissible substitution): Payer: 59 | Admitting: Internal Medicine

## 2012-11-14 ENCOUNTER — Ambulatory Visit (INDEPENDENT_AMBULATORY_CARE_PROVIDER_SITE_OTHER): Payer: Commercial Managed Care - PPO | Admitting: General Surgery

## 2012-11-14 ENCOUNTER — Encounter (INDEPENDENT_AMBULATORY_CARE_PROVIDER_SITE_OTHER): Payer: Self-pay | Admitting: General Surgery

## 2012-11-14 VITALS — BP 116/74 | HR 78 | Resp 16 | Ht 64.0 in | Wt 192.0 lb

## 2012-11-14 DIAGNOSIS — Z09 Encounter for follow-up examination after completed treatment for conditions other than malignant neoplasm: Secondary | ICD-10-CM

## 2012-11-14 NOTE — Progress Notes (Signed)
Subjective:     Patient ID: Theresa Kidd, female   DOB: Nov 17, 1970, 42 y.o.   MRN: [id number]  HPI This is a 42 year old female who I excised the lower chronic abscess in the right axilla. She had a drain in I removed. This is now healed. She had been having a fair amount of numbness on the inner aspect of her right arm and this is getting better. Is about 20-30% better than our last visit she feels like it is improving. The right side is otherwise not causing a lot of problems except for some tightness in this area. She also has a small area on the left that she wanted me to look at today also. there is no drainage erythema or any other symptoms associated with this.  Review of Systems     Objective:   Physical Exam Right axilla with healed wound and no evidence of infection Left axilla has a 2 mm subcutaneous nodule consistent with a likely epidermal inclusion cyst there is no evidence of infection    Assessment:     Status post excision of right axillary infection Left axillary mass    Plan:     I think is giving her some more time to see if the numbness and tingling results as the only thing to do right now. She is in agreement with that. I asked her to call me if this is not considerably better in the next couple of months.  I also told her the the area in the left axilla was small and asymptomatic. I asked her to call me that this area is larger or she has any symptoms related to it.

## 2012-12-08 ENCOUNTER — Encounter: Payer: Self-pay | Admitting: Internal Medicine

## 2012-12-08 ENCOUNTER — Ambulatory Visit (INDEPENDENT_AMBULATORY_CARE_PROVIDER_SITE_OTHER): Payer: 59

## 2012-12-08 ENCOUNTER — Ambulatory Visit (INDEPENDENT_AMBULATORY_CARE_PROVIDER_SITE_OTHER): Payer: 59 | Admitting: Internal Medicine

## 2012-12-08 VITALS — BP 120/80 | HR 102 | Ht 64.0 in | Wt 192.0 lb

## 2012-12-08 DIAGNOSIS — L500 Allergic urticaria: Secondary | ICD-10-CM

## 2012-12-08 LAB — SEDIMENTATION RATE: Sed Rate: 13 mm/hr (ref 0–22)

## 2012-12-08 MED ORDER — MONTELUKAST SODIUM 10 MG PO TABS: 10.0000 mg | ORAL_TABLET | Freq: Every day | ORAL | Status: AC

## 2012-12-08 MED ORDER — LEVOCETIRIZINE DIHYDROCHLORIDE 5 MG PO TABS: 5.0000 mg | ORAL_TABLET | Freq: Every evening | ORAL | Status: DC

## 2012-12-08 MED ORDER — MONTELUKAST SODIUM 10 MG PO TABS: 10.0000 mg | ORAL_TABLET | Freq: Every day | ORAL | Status: DC

## 2012-12-08 NOTE — Patient Instructions (Addendum)
Order- lab- Allergy Profile, Angioedema panel, ANA, Sed rate    Dx Urticaria  Script for Xyzal antihistamine                    Also take Pepcid 10 mg otc   1 daily  Script for Singulair  1 daily

## 2012-12-08 NOTE — Progress Notes (Signed)
12/08/12- 42 yo F never smoker referred courtesy of Dr Drue Novel for allergy evaluation due to hives Hives off and on since her teenage years. Skin test positive for dust around age 51 and diagnosed with eczema. Was on allergy vaccine. Hives and been worse for 7 years, coming and going over most of that time but more persistent recently. Some sneezing without rhinitis. Denies asthma, food allergy, intolerance to cosmetics or other contact including detergents. Hives usually flare at night. Mostly on extremities rub of the trunk or face. She takes Claritin or Zyrtec without much benefit. Tried Benadryl cream. By mouth Benadryl causes sleepiness. No problems with latex, contrast dye or aspirin. Denies liver or thyroid disease. Has had thyroid evaluation. Brother had significant allergic rhinitis. Daughter has hives. Sister has allergic rhinitis. Mother has rheumatoid arthritis. 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 Married  Prior to Admission medications   Medication Sig Start Date End Date Taking? Authorizing Provider  escitalopram (LEXAPRO) 20 MG tablet Take 1 tablet (20 mg total) by mouth daily. 11/06/12  Yes Wanda Plump, MD  levocetirizine (XYZAL) 5 MG tablet Take 1 tablet (5 mg total) by mouth every evening. 12/08/12 12/08/13  Waymon Budge, MD  montelukast (SINGULAIR) 10 MG tablet Take 1 tablet (10 mg total) by mouth at bedtime. 12/08/12 12/08/13  Waymon Budge, MD   Past Medical History  Diagnosis Date  . Fibroids   . Ectopic pregnancy   . Thyromegaly 02/04/2012  . Anxiety 02/04/2012   Past Surgical History  Procedure Laterality Date  . Tonsillectomy and adenoidectomy  1996  . Myomectomy    . Mass excision  10/01/2012    Procedure: EXCISION MASS;  Surgeon: Emelia Loron, MD;  Location: [facility];  Service: General;  Laterality: Right;  Right axillary mass excision   Family History  Problem Relation Age of Onset  . Heart disease Mother   . Stroke Mother   .  Hypertension Mother   . Heart disease Father   . Stroke Father   . Hypertension Father   . Diabetes Maternal Uncle    History   Social History  . Marital Status: Married    Spouse Name: N/A    Number of Children: N/A  . Years of Education: Masters   Occupational History  . Nurse [employer]   Social History Main Topics  . Smoking status: Never Smoker   . Smokeless tobacco: Never Used  . Alcohol Use: No  . Drug Use: No  . Sexually Active: Not on file   Other Topics Concern  . Not on file   Social History Narrative   Last pap-4.3.12.   # of pregnancies-4   # of live births-2   Last mammogram-11-08-2010   ROS-see HPI Constitutional:   No-   weight loss, night sweats, fevers, chills, fatigue, lassitude. HEENT:   No-  headaches, difficulty swallowing, tooth/dental problems, sore throat,       + sneezing, itching, ear ache, nasal congestion, post nasal drip,  CV:  No-   chest pain, orthopnea, PND, swelling in lower extremities, anasarca,                                  dizziness, palpitations Resp: No-   shortness of breath with exertion or at rest.              No-   productive cough,  No non-productive cough,  No- coughing up of blood.              No-   change in color of mucus.  No- wheezing.   Skin: + HPI GI:  No-   heartburn, indigestion, abdominal pain, nausea, vomiting, diarrhea,                 change in bowel habits, loss of appetite GU: No-   dysuria, change in color of urine, no urgency or frequency.  No- flank pain. MS:  No-   joint pain or swelling.  No- decreased range of motion.  No- back pain. Neuro-     nothing unusual Psych:  No- change in mood or affect. No depression or anxiety.  No memory loss.  OBJ- Physical Exam General- Alert, Oriented, Affect-appropriate, Distress- none acute Skin- rash-none, lesions- none, excoriation- none Lymphadenopathy- none Head- atraumatic            Eyes- Gross vision intact, PERRLA, conjunctivae and secretions clear             Ears- Hearing, canals-normal            Nose- Clear, no-Septal dev, mucus, polyps, erosion, perforation             Throat- Mallampati II , mucosa clear , drainage- none, tonsils- atrophic Neck- flexible , trachea midline, no stridor , thyroid nl, carotid no bruit Chest - symmetrical excursion , unlabored           Heart/CV- RRR , no murmur , no gallop  , no rub, nl s1 s2                           - JVD- none , edema- none, stasis changes- none, varices- none           Lung- clear to P&A, wheeze- none, cough- none , dullness-none, rub- none           Chest wall-  Abd- tender-no, distended-no, bowel sounds-present, HSM- no Br/ Gen/ Rectal- Not done, not indicated Extrem- cyanosis- none, clubbing, none, atrophy- none, strength- nl Neuro- grossly intact to observation

## 2012-12-09 LAB — ALLERGY FULL PROFILE
Aspergillus fumigatus, m3: <0.1 kU/L
Bermuda Grass: 0.13 kU/L — ABNORMAL HIGH
Box Elder IgE: <0.1 kU/L
Candida Albicans: <0.1 kU/L
Curvularia lunata: <0.1 kU/L
D. farinae: 0.96 kU/L — ABNORMAL HIGH
Elm IgE: <0.1 kU/L
G005 Rye, Perennial: 0.28 kU/L — ABNORMAL HIGH
G009 Red Top: 0.24 kU/L — ABNORMAL HIGH
IgE (Immunoglobulin E), Serum: 56.9 IU/mL (ref 0.0–180.0)
Oak: <0.1 kU/L
Timothy Grass: 0.24 kU/L — ABNORMAL HIGH

## 2012-12-09 LAB — ANA: Anti Nuclear Antibody(ANA): NEGATIVE

## 2012-12-13 DIAGNOSIS — L500 Allergic urticaria: Secondary | ICD-10-CM | POA: Insufficient documentation

## 2012-12-13 NOTE — Assessment & Plan Note (Signed)
We discussed the roles of hormones, stress, heat and various exposures.. There is no obvious specific trigger that this has been going on a long time suggesting an underlying or ongoing cause. Plan-allergy profile, angioedema profile, ANA, sedimentation rate. Try Xyzal/ Pepcid/Singulair combination

## 2012-12-17 NOTE — Progress Notes (Signed)
Quick Note:  Pt aware of results. ______ 

## 2013-01-12 ENCOUNTER — Ambulatory Visit: Payer: 59 | Admitting: Internal Medicine

## 2013-01-13 ENCOUNTER — Ambulatory Visit (INDEPENDENT_AMBULATORY_CARE_PROVIDER_SITE_OTHER): Payer: Commercial Managed Care - PPO | Admitting: Surgery

## 2013-01-13 ENCOUNTER — Encounter (INDEPENDENT_AMBULATORY_CARE_PROVIDER_SITE_OTHER): Payer: Self-pay | Admitting: Surgery

## 2013-01-13 VITALS — BP 124/78 | HR 84 | Temp 98.5°F | Resp 18 | Ht 64.0 in | Wt 188.0 lb

## 2013-01-13 DIAGNOSIS — L732 Hidradenitis suppurativa: Secondary | ICD-10-CM

## 2013-01-13 MED ORDER — DOXYCYCLINE HYCLATE 100 MG PO TABS: 100.0000 mg | ORAL_TABLET | Freq: Two times a day (BID) | ORAL | Status: DC

## 2013-01-13 NOTE — Patient Instructions (Addendum)
Continue use of Dial soap Clip hair instead of using razor  Hidradenitis Suppurativa, Sweat Gland Abscess Hidradenitis suppurativa is a long lasting (chronic), uncommon disease of the sweat glands. With this, boil-like lumps and scarring develop in the groin, some times under the arms (axillae), and under the breasts. It may also uncommonly occur behind the ears, in the crease of the buttocks, and around the genitals.  CAUSES  The cause is from a blocking of the sweat glands. They then become infected. It may cause drainage and odor. It is not contagious. So it cannot be given to someone else. It most often shows up in puberty (about 57 to 42 years of age). But it may happen much later. It is similar to acne which is a disease of the sweat glands. This condition is slightly more common in African-Americans and women. SYMPTOMS   Hidradenitis usually starts as one or more red, tender, swellings in the groin or under the arms (axilla).  Over a period of hours to days the lesions get larger. They often open to the skin surface, draining clear to yellow-colored fluid.  The infected area heals with scarring. DIAGNOSIS  Your caregiver makes this diagnosis by looking at you. Sometimes cultures (growing germs on plates in the lab) may be taken. This is to see what germ (bacterium) is causing the infection.  TREATMENT   Topical germ killing medicine applied to the skin (antibiotics) are the treatment of choice. Antibiotics taken by mouth (systemic) are sometimes needed when the condition is getting worse or is severe.  Avoid tight-fitting clothing which traps moisture in.  Dirt does not cause hidradenitis and it is not caused by poor hygiene.  Involved areas should be cleaned daily using an antibacterial soap. Some patients find that the liquid form of Lever 2000, applied to the involved areas as a lotion after bathing, can help reduce the odor related to this condition.  Sometimes surgery is needed  to drain infected areas or remove scarred tissue. Removal of large amounts of tissue is used only in severe cases.  Birth control pills may be helpful.  Oral retinoids (vitamin A derivatives) for 6 to 12 months which are effective for acne may also help this condition.  Weight loss will improve but not cure hidradenitis. It is made worse by being overweight. But the condition is not caused by being overweight.  This condition is more common in people who have had acne.  It may become worse under stress. There is no medical cure for hidradenitis. It can be controlled, but not cured. The condition usually continues for years with periods of getting worse and getting better (remission). Document Released: 05/29/2004 Document Revised: 01/07/2012 Document Reviewed: 06/14/2008 [facility] Patient Information 2013 [place], [place].

## 2013-01-13 NOTE — Progress Notes (Signed)
URGENT Office Theresa Kidd [place] 42 y.o.  Body mass index is 32.25 kg/(m^2).  Patient Active Problem List  Diagnosis  . Anxiety  . Thyromegaly  . Allergic urticaria    No Known Allergies  Past Surgical History  Procedure Laterality Date  . Tonsillectomy and adenoidectomy  1996  . Myomectomy    . Mass excision  10/01/2012    Procedure: EXCISION MASS;  Surgeon: Emelia Loron, MD;  Location: [facility];  Service: General;  Laterality: Right;  Right axillary mass excision   Willow Ora, MD No diagnosis found.  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  1% lido with neut and infiltration;  I & D with 11 blade and purulence expressed.   Will give 10 days of doxycycline.   Return as needed Matt B. Daphine Deutscher, MD, [facility] [phone] beeper [phone]  01/13/2013 4:15 PM

## 2013-02-19 ENCOUNTER — Telehealth: Payer: Self-pay | Admitting: Internal Medicine

## 2013-02-19 NOTE — Telephone Encounter (Signed)
All this is better accomplished during a complete physical. Please arrange. If needed put together two 15-min appointments

## 2013-02-19 NOTE — Telephone Encounter (Signed)
Please advise on request for immunizations and chest xray.   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 I don't recall when I last had it done. Would Dr. Drue Novel be willing to have titers done for this or do I need to have it done , again. Also, can Dr. Drue Novel order a CXR as I have always had a poss TB skin test? Please let me know. This is really important.Thank you, [name]

## 2013-02-19 NOTE — Telephone Encounter (Signed)
Discussed with pt, schedule CPE 4.28.14 @ 3pm.

## 2013-02-23 ENCOUNTER — Ambulatory Visit (INDEPENDENT_AMBULATORY_CARE_PROVIDER_SITE_OTHER): Payer: 59 | Admitting: Internal Medicine

## 2013-02-23 ENCOUNTER — Encounter: Payer: Self-pay | Admitting: Internal Medicine

## 2013-02-23 VITALS — BP 132/82 | HR 77 | Temp 99.5°F | Ht 64.5 in | Wt 191.0 lb

## 2013-02-23 DIAGNOSIS — Z23 Encounter for immunization: Secondary | ICD-10-CM

## 2013-02-23 DIAGNOSIS — Z0000 Encounter for general adult medical examination without abnormal findings: Secondary | ICD-10-CM | POA: Insufficient documentation

## 2013-02-23 DIAGNOSIS — E010 Iodine-deficiency related diffuse (endemic) goiter: Secondary | ICD-10-CM

## 2013-02-23 NOTE — Patient Instructions (Addendum)
Please get your x-ray at the other [facility]  office located at: [address] [place], across from [facility].  Please go to the basement, this is a walk-in facility, they are open from 8:30 to 5:30 PM. Phone number [phone]. --- Please come back fasting for a FLP-- dx V70

## 2013-02-23 NOTE — Assessment & Plan Note (Signed)
U/s was negative, TFTs were normal

## 2013-02-23 NOTE — Assessment & Plan Note (Addendum)
Requests a Tdap for a   employment issue. Polio vaccine, was told to get vaccinated again for employment issues. She knows she had it before but doesn't have documentation at this point. UTD is reviewed : Adults who are unvaccinated or whose vaccination status is not documented should receive a primary vaccination series with IPV (two doses of IPV at four to eight week intervals and a third dose 6 to 12 months after the second dose) She does visit [place] regularly thus recommend vaccination---> likes to hold off b/c she hopes will be able to get the needed documentation . 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

## 2013-02-23 NOTE — Progress Notes (Signed)
  Subjective:    Patient ID: Theresa Kidd, female    DOB: 05/12/1971, 42 y.o.   MRN: [id number]  HPI CPX  Past Medical History  Diagnosis Date  . Fibroids   . Ectopic pregnancy   . Anxiety 02/04/2012   Past Surgical History  Procedure Laterality Date  . Tonsillectomy and adenoidectomy  1996  . Myomectomy    . Mass excision  10/01/2012    Procedure: EXCISION MASS;  Surgeon: Emelia Loron, MD;  [facility];  Service: General;  Laterality: Right;  Right axillary mass excision   History   Social History  . Marital Status: Married    Spouse Name: N/A    Number of Children: 2  . Years of Education: Masters   Occupational History  . Nurse [employer]   Social History Main Topics  . Smoking status: Never Smoker   . Smokeless tobacco: Never Used  . Alcohol Use: No  . Drug Use: No  . Sexually Active: Not on file   Other Topics Concern  . Not on file   Social History Narrative   Married, 2 children    Moved from [place] [phone]    Occupation: Publishing rights manager , getting a PhD    Life style improved since the last time she was here   ---   Last pap-4.3.12.   # of pregnancies-4   # of live births-2         Family History  Problem Relation Age of Onset  . Stroke Father     37  . Hypertension Mother   . Heart disease Father     F 59s  . Hypertension Father   . Diabetes Maternal Uncle   . Colon cancer Neg Hx   . Breast cancer Neg Hx   . Lung cancer Other     uncle     Review of Systems  Constitutional: Negative for unexpected weight change.  Respiratory: Negative for cough and shortness of breath.   Cardiovascular: Negative for chest pain and leg swelling.  Gastrointestinal: Negative for abdominal pain and blood in stool.  Genitourinary: Negative for hematuria and difficulty urinating.  Psychiatric/Behavioral:       No depression or anxiety        Objective:   Physical Exam  General -- alert, well-developed, NAD Neck  --palpable thyrod, no tender-nodular, slt enlarged? Lungs -- normal respiratory effort, no intercostal retractions, no accessory muscle use, and normal breath sounds.   Heart-- normal rate, regular rhythm, no murmur, and no gallop.   Abdomen--soft, non-tender, no distention, no masses, no HSM, no guarding, and no rigidity.   Extremities-- no pretibial edema bilaterally  Neurologic-- alert & oriented X3 and strength normal in all extremities. Psych-- Cognition and judgment appear intact. Alert and cooperative with normal attention span and concentration.  not anxious appearing and not depressed appearing.       Assessment & Plan:

## 2013-02-24 ENCOUNTER — Ambulatory Visit
Admission: RE | Admit: 2013-02-24 | Discharge: 2013-02-24 | Disposition: A | Discharge status: Home / Self Care | Payer: 59 | Source: Ambulatory Visit | Attending: Internal Medicine | Admitting: Internal Medicine

## 2013-02-24 DIAGNOSIS — Z0000 Encounter for general adult medical examination without abnormal findings: Secondary | ICD-10-CM

## 2013-03-13 ENCOUNTER — Ambulatory Visit: Payer: 59 | Admitting: Internal Medicine

## 2013-05-24 ENCOUNTER — Other Ambulatory Visit: Payer: Self-pay | Admitting: Internal Medicine

## 2013-05-25 NOTE — Telephone Encounter (Signed)
Pt. Requesting refill. Medication not found on med list. Appears it may have been discontinued at last OV 4.28.14. Please advise if refill is appropriate. Thanks.

## 2013-05-25 NOTE — Telephone Encounter (Signed)
Defer  to pulmonary, Dr Maple Hudson

## 2013-05-26 NOTE — Telephone Encounter (Signed)
Ok to continue Lexapro, but should be refilled through her primary. Not appropriate for me to be refilling- I don't treat the problem. If she is still having hives I can see her back to consider Xolair.

## 2013-05-27 ENCOUNTER — Other Ambulatory Visit: Payer: Self-pay | Admitting: *Deleted

## 2013-06-01 NOTE — Telephone Encounter (Signed)
Please see below per Dr. Maple Hudson.  Will route msg to [place] to follow up with Dr. Drue Novel.   Kaitlyn [facility] aware rx will be deferred to Dr. Drue Novel for rxs.

## 2013-06-02 NOTE — Telephone Encounter (Signed)
Dr. Drue Novel Please advise. Thanks.

## 2013-06-02 NOTE — Telephone Encounter (Signed)
If the patient is taking Lexapro 20 mg one by mouth daily then is okay to prescribe 30 and 3 refills. Schedule of his visit in 3 months

## 2013-06-02 NOTE — Telephone Encounter (Signed)
lmovm to return call to clarify with patient if she is currently taking this medication

## 2013-06-03 NOTE — Telephone Encounter (Signed)
Spoke with patient, she states she had stopped taking the medication some time ago however, started developing similar symptoms and restarted taking the medication two weeks ago on her own. Would like refill if she can. She also is willing to schedule OV if needed. Please advise if ok to send rx listed below. Thanks.

## 2013-06-03 NOTE — Telephone Encounter (Signed)
lmovm to return call to schdule OV. Refill denied per orders.

## 2013-06-03 NOTE — Telephone Encounter (Signed)
i'm confused:  no refills, schedule  office visit

## 2013-08-14 IMAGING — CR DG CHEST 2V
2 series · 2 of 2 positions shown · non-contrast
Comparison: None.

CLINICAL DATA: Positive PPD.

CHEST - 2 VIEW

[view not recorded (1 of 2)]
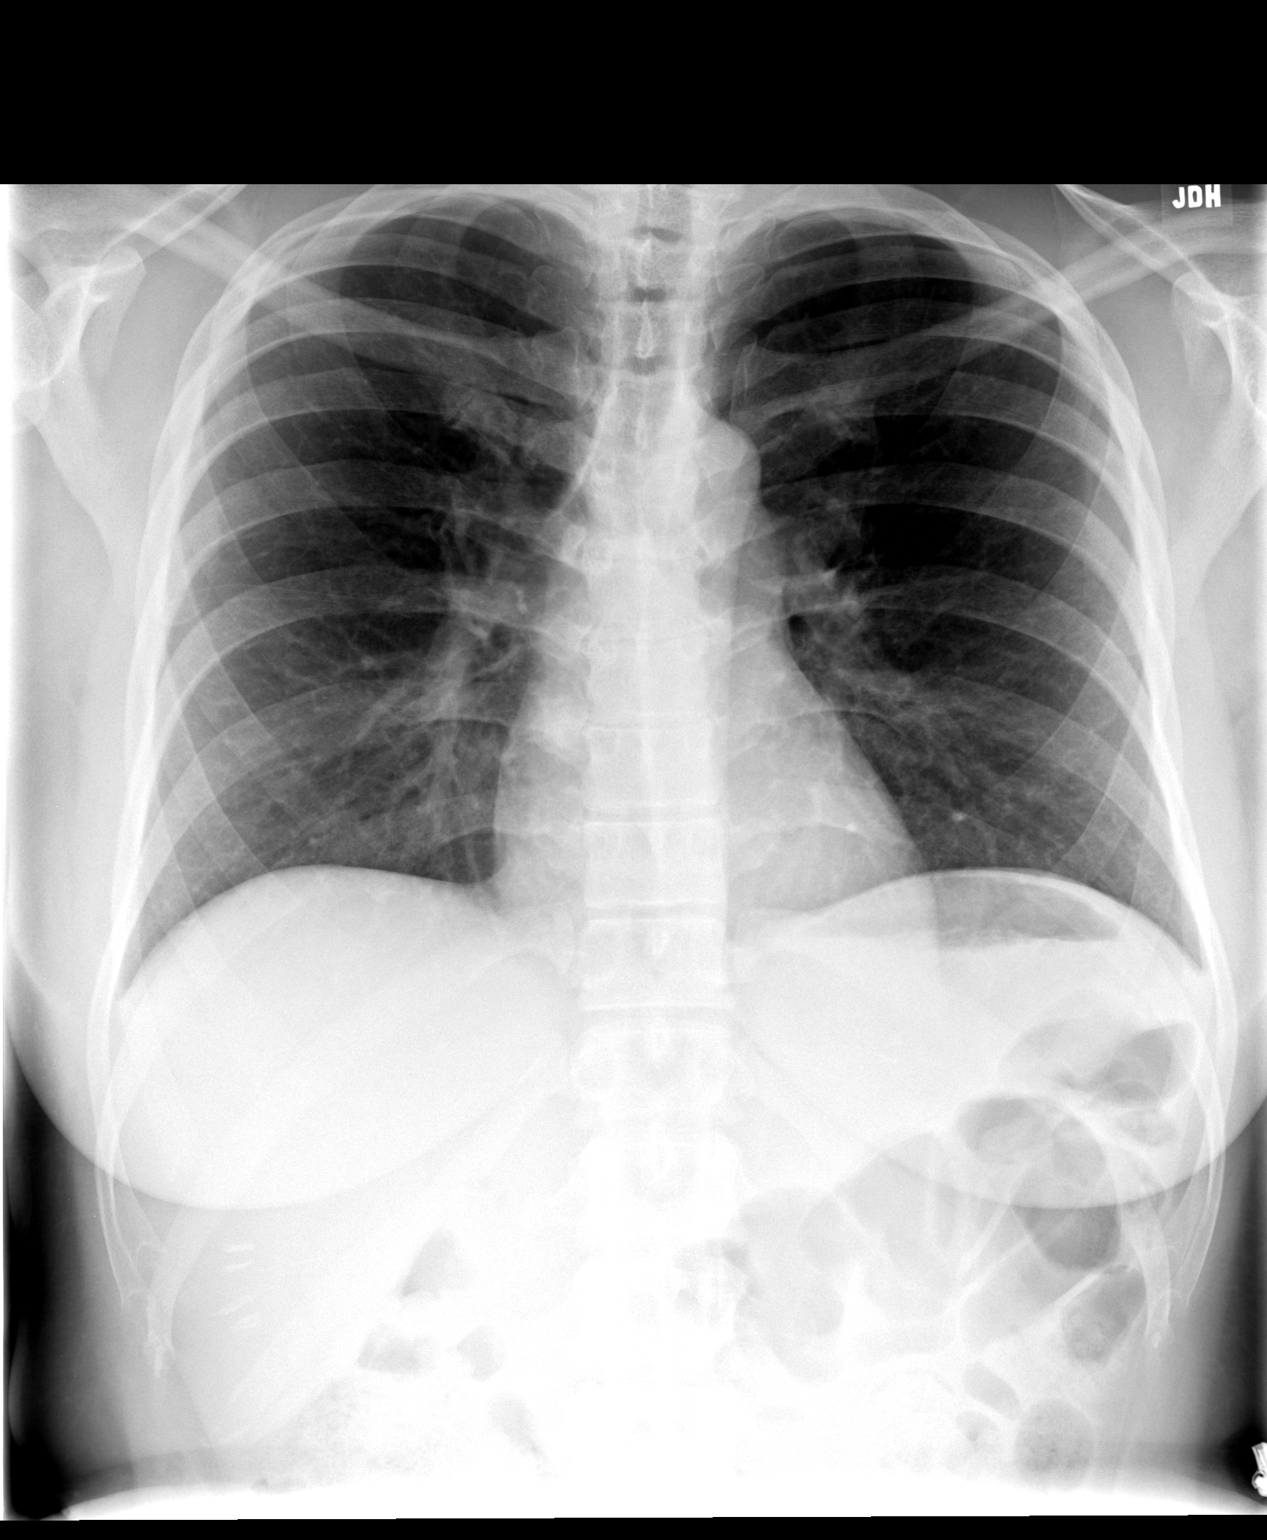

[view not recorded (2 of 2)]
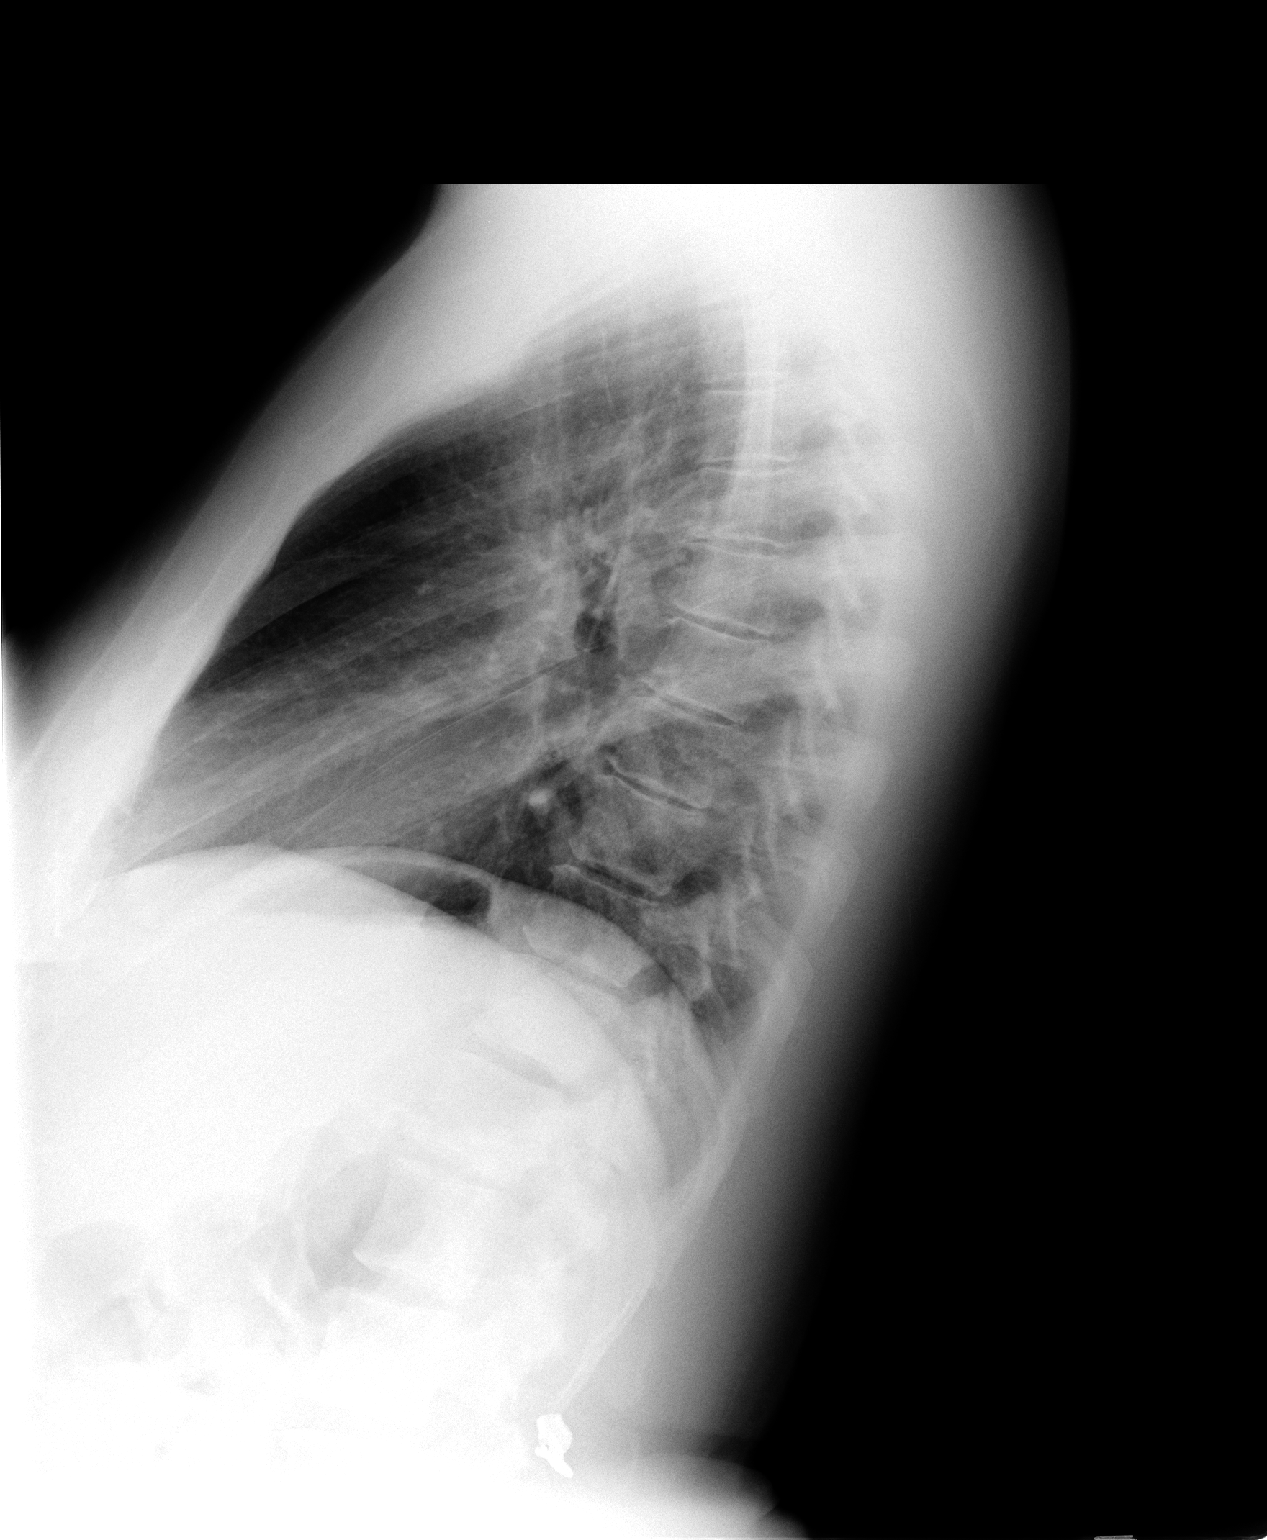

[2 of 2 positions shown; findings below may reference images not displayed]

FINDINGS: Heart size and pulmonary vascularity are normal and the
lungs are clear.  No osseous abnormality.
IMPRESSION: Normal exam.

## 2013-09-03 ENCOUNTER — Other Ambulatory Visit: Payer: Self-pay

## 2014-03-21 ENCOUNTER — Telehealth: Payer: Self-pay | Admitting: Internal Medicine

## 2014-03-21 NOTE — Telephone Encounter (Signed)
Did not have labs rx  at the time of her CPX, please arrange: FLP-- dx V70

## 2014-03-23 NOTE — Telephone Encounter (Signed)
thx

## 2014-03-23 NOTE — Telephone Encounter (Signed)
Pt no longer with this practice. Pt states moved out of [place].

## 2018-08-10 ENCOUNTER — Inpatient Hospital Stay
Admit: 2018-08-10 | Discharge: 2018-08-10 | Disposition: A | Discharge status: Home / Self Care | Payer: BLUE CROSS/BLUE SHIELD | Attending: Emergency Medicine

## 2018-08-10 ENCOUNTER — Emergency Department: Admit: 2018-08-10 | Payer: BLUE CROSS/BLUE SHIELD | Primary: Internal Medicine

## 2018-08-10 DIAGNOSIS — M5412 Radiculopathy, cervical region: Secondary | ICD-10-CM

## 2018-08-10 MED ORDER — DEXAMETHASONE 4 MG TAB
4 mg | ORAL | Status: AC
Start: 2018-08-10 — End: 2018-08-10
  Administered 2018-08-10: 17:00:00 via ORAL

## 2018-08-10 MED FILL — DEXAMETHASONE 4 MG TAB: 4 mg | ORAL | Qty: 3

## 2018-08-10 NOTE — ED Notes (Signed)
Pt. States she's  Been having sharp shooting pain in right arm for 1 1/2 weeks and states 2 weeks ago woke up with right sided neck pain that resolved.  Today dropped pen and cellphone.  Complains of neck pain  And swelling as well.  Pt. Denies any injury

## 2018-08-10 NOTE — ED Provider Notes (Signed)
'R sided neck pain x 3 weeks/ bothgered me a few days/ seemed like it was better/ now R arm/ hand 'shooting' pain x 1.5 weeks/ 3 days 'been dropping things with my R hand (cell phone / etc)    R handed    Last ibu at 0900 (800 mg);     pt denies HA, vison changes, diff swallowing, CP, SOB, Abd pain, F/Ch, N/V, D/Cons or other current systemic complaints    Social History    Socioeconomic History      Marital status: MARRIED      Spouse name: Not on file      Number of children: Not on file      Years of education: Not on file      Highest education level: Not on file    Occupational History      Not on file    Social Needs      Financial resource strain: Not on file      Food insecurity:        Worry: Not on file        Inability: Not on file      Transportation needs:        Medical: Not on file        Non-medical: Not on file    Tobacco Use      Smoking status: Never Smoker      Smokeless tobacco: Never Used    Substance and Sexual Activity      Alcohol use: Never        Frequency: Never      Drug use: Never      Sexual activity: Never    Lifestyle      Physical activity:        Days per week: Not on file        Minutes per session: Not on file      Stress: Not on file    Relationships      Social connections:        Talks on phone: Not on file        Gets together: Not on file        Attends religious service: Not on file        Active member of club or organization: Not on file        Attends meetings of clubs or organizations: Not on file        Relationship status: Not on file      Intimate partner violence:        Fear of current or ex partner: Not on file        Emotionally abused: Not on file        Physically abused: Not on file        Forced sexual activity: Not on file    Other Topics      Concerns:        Not on file    Social History Narrative      Not on file      The history is provided by the patient.        No past medical history on file.    No past surgical history on file.      No family history  on file.    Social History     Socioeconomic History   ??? Marital status: Not on file     Spouse name: Not on file   ??? Number of  children: Not on file   ??? Years of education: Not on file   ??? Highest education level: Not on file   Occupational History   ??? Not on file   Social Needs   ??? Financial resource strain: Not on file   ??? Food insecurity:     Worry: Not on file     Inability: Not on file   ??? Transportation needs:     Medical: Not on file     Non-medical: Not on file   Tobacco Use   ??? Smoking status: Not on file   Substance and Sexual Activity   ??? Alcohol use: Not on file   ??? Drug use: Not on file   ??? Sexual activity: Not on file   Lifestyle   ??? Physical activity:     Days per week: Not on file     Minutes per session: Not on file   ??? Stress: Not on file   Relationships   ??? Social connections:     Talks on phone: Not on file     Gets together: Not on file     Attends religious service: Not on file     Active member of club or organization: Not on file     Attends meetings of clubs or organizations: Not on file     Relationship status: Not on file   ??? Intimate partner violence:     Fear of current or ex partner: Not on file     Emotionally abused: Not on file     Physically abused: Not on file     Forced sexual activity: Not on file   Other Topics Concern   ??? Not on file   Social History Narrative   ??? Not on file         ALLERGIES: Patient has no allergy information on record.    Review of Systems   Constitutional: Negative for appetite change, diaphoresis and fever.   HENT: Negative for drooling, trouble swallowing and voice change.    Eyes: Negative for photophobia and visual disturbance.   Respiratory: Negative for cough, choking, chest tightness and shortness of breath.    Cardiovascular: Negative for chest pain, palpitations and leg swelling.   Gastrointestinal: Negative for abdominal pain, diarrhea, nausea and vomiting.   Genitourinary: Negative for dysuria.   Musculoskeletal: Positive for neck pain.  Negative for back pain.   Neurological: Negative for dizziness, tremors, facial asymmetry and weakness.   All other systems reviewed and are negative.      There were no vitals filed for this visit.         Physical Exam   Constitutional: She is oriented to person, place, and time. She appears well-developed and well-nourished.   NAD, AxOx4, speaking in complete sentences  gcs = 15       HENT:   Head: Normocephalic and atraumatic.   Nose: Nose normal.   Cn intact       Eyes: Pupils are equal, round, and reactive to light. Conjunctivae are normal. Right eye exhibits no discharge. No scleral icterus.   Neck: Normal range of motion. Neck supple. No JVD present. No tracheal deviation present.   Cardiovascular: Normal rate, regular rhythm, normal heart sounds and intact distal pulses. Exam reveals no gallop and no friction rub.   No murmur heard.  Pulmonary/Chest: Effort normal and breath sounds normal. No respiratory distress. She has no wheezes. She has no rales. She exhibits no tenderness.   Abdominal:  Soft. Bowel sounds are normal. There is no tenderness. There is no rebound and no guarding.   nttp     Genitourinary: No vaginal discharge found.   Musculoskeletal: Normal range of motion. She exhibits no edema, tenderness or deformity.   Neurological: She is alert and oriented to person, place, and time. She displays normal reflexes. No cranial nerve deficit or sensory deficit. She exhibits normal muscle tone. Coordination normal.   pt has motor/ CV/ Sensation grossly intact to all extremities, R = L in strength;   Skin: Skin is warm and dry. Capillary refill takes less than 2 seconds. No rash noted. No erythema. No pallor.   Psychiatric: She has a normal mood and affect. Her behavior is normal. Thought content normal.   Nursing note and vitals reviewed.       MDM       Procedures      1:55 PM  Sallye OberCarol Marcello's  results have been reviewed with her.  She has been counseled regarding her diagnosis.  She verbally  conveys understanding and agreement of the signs, symptoms, diagnosis, treatment and prognosis and additionally agrees to Call/ Arrange follow up as recommended with Dr. Vallery RidgeGoode, Lauren, MD in 24 - 48 hours.  She also agrees with the care-plan and conveys that all of her questions have been answered.  I have also put together some discharge instructions for her that include: 1) educational information regarding their diagnosis, 2) how to care for their diagnosis at home, as well a 3) list of reasons why they would want to return to the ED prior to their follow-up appointment, should their condition change or for concerns.

## 2018-08-10 NOTE — ED Notes (Signed)
Dr. Markus Daft and I have reviewed discharge instructions with the patient.  The patient verbalized understanding.  Pt. Given CD of CT.

## 2018-08-10 NOTE — ED Notes (Signed)
Dr. Coyner and I have reviewed discharge instructions with the patient.  The patient verbalized understanding.  Pt. Given CD of CT.

## 2018-08-10 NOTE — ED Provider Notes (Signed)
'R sided neck pain x 3 weeks/ bothgered me a few days/ seemed like it was better/ now R arm/ hand 'shooting' pain x 1.5 weeks/ 3 days 'been dropping things with my R hand (cell phone / etc)    R handed    Last ibu at 0900 (800 mg);     pt denies HA, vison changes, diff swallowing, CP, SOB, Abd pain, F/Ch, N/V, D/Cons or other current systemic complaints    Social History    Socioeconomic History      Marital status: MARRIED      Spouse name: Not on file      Number of children: Not on file      Years of education: Not on file      Highest education level: Not on file    Occupational History      Not on file    Social Needs      Financial resource strain: Not on file      Food insecurity:        Worry: Not on file        Inability: Not on file      Transportation needs:        Medical: Not on file        Non-medical: Not on file    Tobacco Use      Smoking status: Never Smoker      Smokeless tobacco: Never Used    Substance and Sexual Activity      Alcohol use: Never        Frequency: Never      Drug use: Never      Sexual activity: Never    Lifestyle      Physical activity:        Days per week: Not on file        Minutes per session: Not on file      Stress: Not on file    Relationships      Social connections:        Talks on phone: Not on file        Gets together: Not on file        Attends religious service: Not on file        Active member of club or organization: Not on file        Attends meetings of clubs or organizations: Not on file        Relationship status: Not on file      Intimate partner violence:        Fear of current or ex partner: Not on file        Emotionally abused: Not on file        Physically abused: Not on file        Forced sexual activity: Not on file    Other Topics      Concerns:        Not on file    Social History Narrative      Not on file      The history is provided by the patient.        No past medical history on file.    No past surgical history on file.       No family history on file.    Social History     Socioeconomic History   ??? Marital status: Not on file     Spouse name: Not on file   ??? Number of  children: Not on file   ??? Years of education: Not on file   ??? Highest education level: Not on file   Occupational History   ??? Not on file   Social Needs   ??? Financial resource strain: Not on file   ??? Food insecurity:     Worry: Not on file     Inability: Not on file   ??? Transportation needs:     Medical: Not on file     Non-medical: Not on file   Tobacco Use   ??? Smoking status: Not on file   Substance and Sexual Activity   ??? Alcohol use: Not on file   ??? Drug use: Not on file   ??? Sexual activity: Not on file   Lifestyle   ??? Physical activity:     Days per week: Not on file     Minutes per session: Not on file   ??? Stress: Not on file   Relationships   ??? Social connections:     Talks on phone: Not on file     Gets together: Not on file     Attends religious service: Not on file     Active member of club or organization: Not on file     Attends meetings of clubs or organizations: Not on file     Relationship status: Not on file   ??? Intimate partner violence:     Fear of current or ex partner: Not on file     Emotionally abused: Not on file     Physically abused: Not on file     Forced sexual activity: Not on file   Other Topics Concern   ??? Not on file   Social History Narrative   ??? Not on file         ALLERGIES: Patient has no allergy information on record.    Review of Systems   Constitutional: Negative for appetite change, diaphoresis and fever.   HENT: Negative for drooling, trouble swallowing and voice change.    Eyes: Negative for photophobia and visual disturbance.   Respiratory: Negative for cough, choking, chest tightness and shortness of breath.    Cardiovascular: Negative for chest pain, palpitations and leg swelling.   Gastrointestinal: Negative for abdominal pain, diarrhea, nausea and vomiting.   Genitourinary: Negative for dysuria.    Musculoskeletal: Positive for neck pain. Negative for back pain.   Neurological: Negative for dizziness, tremors, facial asymmetry and weakness.   All other systems reviewed and are negative.      There were no vitals filed for this visit.         Physical Exam   Constitutional: She is oriented to person, place, and time. She appears well-developed and well-nourished.   NAD, AxOx4, speaking in complete sentences  gcs = 15       HENT:   Head: Normocephalic and atraumatic.   Nose: Nose normal.   Cn intact       Eyes: Pupils are equal, round, and reactive to light. Conjunctivae are normal. Right eye exhibits no discharge. No scleral icterus.   Neck: Normal range of motion. Neck supple. No JVD present. No tracheal deviation present.   Cardiovascular: Normal rate, regular rhythm, normal heart sounds and intact distal pulses. Exam reveals no gallop and no friction rub.   No murmur heard.  Pulmonary/Chest: Effort normal and breath sounds normal. No respiratory distress. She has no wheezes. She has no rales. She exhibits no tenderness.   Abdominal:  Soft. Bowel sounds are normal. There is no tenderness. There is no rebound and no guarding.   nttp     Genitourinary: No vaginal discharge found.   Musculoskeletal: Normal range of motion. She exhibits no edema, tenderness or deformity.   Neurological: She is alert and oriented to person, place, and time. She displays normal reflexes. No cranial nerve deficit or sensory deficit. She exhibits normal muscle tone. Coordination normal.   pt has motor/ CV/ Sensation grossly intact to all extremities, R = L in strength;   Skin: Skin is warm and dry. Capillary refill takes less than 2 seconds. No rash noted. No erythema. No pallor.   Psychiatric: She has a normal mood and affect. Her behavior is normal. Thought content normal.   Nursing note and vitals reviewed.       MDM       Procedures      1:55 PM  Rome Schlauch  results have been reviewed with her.  She has been  counseled regarding her diagnosis.  She verbally conveys understanding and agreement of the signs, symptoms, diagnosis, treatment and prognosis and additionally agrees to Call/ Arrange follow up as recommended with Dr. Vallery Ridge, MD in 24 - 48 hours.  She also agrees with the care-plan and conveys that all of her questions have been answered.  I have also put together some discharge instructions for her that include: 1) educational information regarding their diagnosis, 2) how to care for their diagnosis at home, as well a 3) list of reasons why they would want to return to the ED prior to their follow-up appointment, should their condition change or for concerns.

## 2018-08-10 NOTE — ED Triage Notes (Addendum)
Pt. States she's  Been having sharp shooting pain in right arm for 1 1/2 weeks and states 2 weeks ago woke up with right sided neck pain that resolved.  Today dropped pen and cellphone.  Complains of neck pain  And swelling as well.  Pt. Denies any injury

## 2019-01-19 ENCOUNTER — Inpatient Hospital Stay
Admit: 2019-01-19 | Discharge: 2019-01-19 | Disposition: A | Discharge status: Home / Self Care | Payer: BLUE CROSS/BLUE SHIELD | Attending: Emergency Medicine

## 2019-01-19 ENCOUNTER — Emergency Department: Admit: 2019-01-19 | Payer: BLUE CROSS/BLUE SHIELD | Primary: Internal Medicine

## 2019-01-19 DIAGNOSIS — M25562 Pain in left knee: Secondary | ICD-10-CM

## 2019-01-19 MED ORDER — MORPHINE 2 MG/ML INJECTION
2 mg/mL | Freq: Once | INTRAMUSCULAR | Status: AC
Start: 2019-01-19 — End: 2019-01-19
  Administered 2019-01-19: 22:00:00 via INTRAMUSCULAR

## 2019-01-19 MED ORDER — OXYCODONE-ACETAMINOPHEN 5 MG-325 MG TAB
5-325 mg | ORAL_TABLET | Freq: Four times a day (QID) | ORAL | 0 refills | Status: AC | PRN
Start: 2019-01-19 — End: 2019-01-22

## 2019-01-19 MED FILL — MORPHINE 2 MG/ML INJECTION: 2 mg/mL | INTRAMUSCULAR | Qty: 2

## 2019-01-19 NOTE — ED Provider Notes (Signed)
Pt is a 48 year old female presenting to ED via EMS for evaluation of left "knee locking". She reports at 1630 today she was bending over and felt a "pop" and onset of pain, after she was unable to walk, flex, or extend her knee. She explains that she feels like something is "stuck in there". A similar episode occurred 2 weeks ago with the same mechanism, but she did not experience the popping feeling at that time. She denies previous injury to the knee or leg. Pain is rated 8/10 with radiation to the left lower leg. No treatment prior to arrival.            Past Medical History:   Diagnosis Date   ??? Molar pregnancy    ??? Psychiatric disorder     depresstion       Past Surgical History:   Procedure Laterality Date   ??? HX CERVICAL FUSION     ??? HX HEENT      T&A   ??? HX ORTHOPAEDIC      axillary surgery on right         History reviewed. No pertinent family history.    Social History     Socioeconomic History   ??? Marital status: MARRIED     Spouse name: Not on file   ??? Number of children: Not on file   ??? Years of education: Not on file   ??? Highest education level: Not on file   Occupational History   ??? Not on file   Social Needs   ??? Financial resource strain: Not on file   ??? Food insecurity     Worry: Not on file     Inability: Not on file   ??? Transportation needs     Medical: Not on file     Non-medical: Not on file   Tobacco Use   ??? Smoking status: Never Smoker   ??? Smokeless tobacco: Never Used   Substance and Sexual Activity   ??? Alcohol use: Never     Frequency: Never   ??? Drug use: Never   ??? Sexual activity: Never   Lifestyle   ??? Physical activity     Days per week: Not on file     Minutes per session: Not on file   ??? Stress: Not on file   Relationships   ??? Social Wellsite geologist on phone: Not on file     Gets together: Not on file     Attends religious service: Not on file     Active member of club or organization: Not on file     Attends meetings of clubs or organizations: Not on file     Relationship status:  Not on file   ??? Intimate partner violence     Fear of current or ex partner: Not on file     Emotionally abused: Not on file     Physically abused: Not on file     Forced sexual activity: Not on file   Other Topics Concern   ??? Not on file   Social History Narrative   ??? Not on file         ALLERGIES: Apple; Banana; and Strawberry    Review of Systems   Constitutional: Negative for fever.   HENT: Negative for congestion and sore throat.    Eyes: Negative for visual disturbance.   Respiratory: Negative for cough and shortness of breath.    Cardiovascular: Negative for  chest pain.   Gastrointestinal: Negative for abdominal distention, diarrhea, nausea and vomiting.   Genitourinary: Negative for dysuria.   Musculoskeletal: Positive for arthralgias (left knee pain). Negative for joint swelling.   Skin: Negative for color change.   Neurological: Negative for dizziness, syncope and numbness.   Psychiatric/Behavioral: Negative for confusion.       Vitals:    01/19/19 1727   BP: 162/55   Pulse: 76   Resp: 16   Temp: 98.4 ??F (36.9 ??C)   SpO2: 99%   Weight: 100.2 kg (220 lb 14.4 oz)   Height: 5\' 4"  (1.626 m)            Physical Exam  Vitals signs and nursing note reviewed.   Constitutional:       Appearance: Normal appearance. She is not toxic-appearing.   HENT:      Head: Normocephalic and atraumatic.      Mouth/Throat:      Pharynx: Oropharynx is clear.   Eyes:      Conjunctiva/sclera: Conjunctivae normal.   Neck:      Musculoskeletal: Normal range of motion and neck supple.   Cardiovascular:      Rate and Rhythm: Normal rate and regular rhythm.   Pulmonary:      Effort: Pulmonary effort is normal.      Breath sounds: Normal breath sounds.   Abdominal:      Palpations: Abdomen is soft.      Tenderness: There is no abdominal tenderness.   Musculoskeletal:         General: Tenderness: left knee tender, most pain over joint line.      Right knee: Normal.      Left knee: She exhibits decreased range of motion. She exhibits no  swelling, no effusion, no ecchymosis, no deformity, no laceration, no erythema and normal patellar mobility. Tenderness found. Lateral joint line tenderness noted.   Skin:     General: Skin is warm and dry.   Neurological:      General: No focal deficit present.      Mental Status: She is alert and oriented to person, place, and time.   Psychiatric:         Mood and Affect: Mood normal.          MDM  Number of Diagnoses or Management Options  Acute pain of left knee:     Pt is alert and appears in pain. She is unable to flex or extend her left knee and is most tender over the lateral joint line. Lateral view of xray does not show any acute fracture or displacement, unable to get additional views due to patient pain. Dr. Dedra Skeens attempted to straighten the leg, which caused extreme pain for her. Consult with orthopedics done, they suspect a meniscal tear and displacement. Appointment set up for tomorrow with Dr. Jettie Booze. She is discharged home with short-term pain management. Unable to place knee immobilizer due to patient pain. She agrees with current treatment plan. All questions answered.        Procedures    I personally saw and examined the patient.  I have reviewed and agree with the MLP's findings, including all diagnostic interpretations, and plans as written.   I was present during the key portions of separately billed procedures.  Ms. Minnie presents to the ER with left knee pain and unable to straighten her leg.  Concern for possible meniscal tear with flipped piece of cartilage.  I try to, after the  patient see morphine, to provide longitudinal traction and straight leg in hopes that the cartilage with up and she can straighten her leg.  This was unsuccessful.  I have spoken with orthopedic surgery who will see her tomorrow in follow-up.  Verl Bangs, MD

## 2019-01-19 NOTE — ED Notes (Signed)
Pt discharged in stable condition at this time. MD reviewed discharge instructions, follow up and prescription(s) with patient at bedside. Pt verbalized understanding and denies any needs or questions.     Pt provided with crutches at this time and assisted to personal car in wheelchair via this RN.

## 2019-01-19 NOTE — ED Notes (Signed)
 TRIAGE NOTE: Patient arrived from home with c/o LEFT knee pain. I was walking and then my knee locked up.

## 2019-01-19 NOTE — ED Triage Notes (Signed)
TRIAGE NOTE: Patient arrived from home with c/o LEFT knee pain. "I was walking and then my knee locked up."

## 2019-01-19 NOTE — ED Notes (Addendum)
Pt discharged in stable condition at this time. MD reviewed discharge instructions, follow up and prescription(s) with patient at bedside. Pt verbalized understanding and denies any needs or questions.     Pt provided with crutches at this time and assisted to personal car in wheelchair via this RN.

## 2019-01-19 NOTE — ED Provider Notes (Addendum)
Pt is a 48 year old female presenting to ED via EMS for evaluation of left "knee locking". She reports at 1630 today she was bending over and felt a "pop" and onset of pain, after she was unable to walk, flex, or extend her knee. She explains that she feels like something is "stuck in there". A similar episode occurred 2 weeks ago with the same mechanism, but she did not experience the popping feeling at that time. She denies previous injury to the knee or leg. Pain is rated 8/10 with radiation to the left lower leg. No treatment prior to arrival.            Past Medical History:   Diagnosis Date   ??? Molar pregnancy    ??? Psychiatric disorder     depresstion       Past Surgical History:   Procedure Laterality Date   ??? HX CERVICAL FUSION     ??? HX HEENT      T&A   ??? HX ORTHOPAEDIC      axillary surgery on right         History reviewed. No pertinent family history.    Social History     Socioeconomic History   ??? Marital status: MARRIED     Spouse name: Not on file   ??? Number of children: Not on file   ??? Years of education: Not on file   ??? Highest education level: Not on file   Occupational History   ??? Not on file   Social Needs   ??? Financial resource strain: Not on file   ??? Food insecurity     Worry: Not on file     Inability: Not on file   ??? Transportation needs     Medical: Not on file     Non-medical: Not on file   Tobacco Use   ??? Smoking status: Never Smoker   ??? Smokeless tobacco: Never Used   Substance and Sexual Activity   ??? Alcohol use: Never     Frequency: Never   ??? Drug use: Never   ??? Sexual activity: Never   Lifestyle   ??? Physical activity     Days per week: Not on file     Minutes per session: Not on file   ??? Stress: Not on file   Relationships   ??? Social [employer]     Talks on phone: Not on file     Gets together: Not on file     Attends religious service: Not on file     Active member of club or organization: Not on file     Attends meetings of clubs or organizations: Not on file      Relationship status: Not on file   ??? Intimate partner violence     Fear of current or ex partner: Not on file     Emotionally abused: Not on file     Physically abused: Not on file     Forced sexual activity: Not on file   Other Topics Concern   ??? Not on file   Social History Narrative   ??? Not on file         ALLERGIES: Apple; Banana; and Strawberry    Review of Systems   Constitutional: Negative for fever.   HENT: Negative for congestion and sore throat.    Eyes: Negative for visual disturbance.   Respiratory: Negative for cough and shortness of breath.    Cardiovascular: Negative for  chest pain.   Gastrointestinal: Negative for abdominal distention, diarrhea, nausea and vomiting.   Genitourinary: Negative for dysuria.   Musculoskeletal: Positive for arthralgias (left knee pain). Negative for joint swelling.   Skin: Negative for color change.   Neurological: Negative for dizziness, syncope and numbness.   Psychiatric/Behavioral: Negative for confusion.       Vitals:    01/19/19 1727   BP: 162/55   Pulse: 76   Resp: 16   Temp: 98.4 ??F (36.9 ??C)   SpO2: 99%   Weight: 100.2 kg (220 lb 14.4 oz)   Height: 5\' 4"  (1.626 m)            Physical Exam  Vitals signs and nursing note reviewed.   Constitutional:       Appearance: Normal appearance. She is not toxic-appearing.   HENT:      Head: Normocephalic and atraumatic.      Mouth/Throat:      Pharynx: Oropharynx is clear.   Eyes:      Conjunctiva/sclera: Conjunctivae normal.   Neck:      Musculoskeletal: Normal range of motion and neck supple.   Cardiovascular:      Rate and Rhythm: Normal rate and regular rhythm.   Pulmonary:      Effort: Pulmonary effort is normal.      Breath sounds: Normal breath sounds.   Abdominal:      Palpations: Abdomen is soft.      Tenderness: There is no abdominal tenderness.   Musculoskeletal:         General: Tenderness: left knee tender, most pain over joint line.      Right knee: Normal.       Left knee: She exhibits decreased range of motion. She exhibits no swelling, no effusion, no ecchymosis, no deformity, no laceration, no erythema and normal patellar mobility. Tenderness found. Lateral joint line tenderness noted.   Skin:     General: Skin is warm and dry.   Neurological:      General: No focal deficit present.      Mental Status: She is alert and oriented to person, place, and time.   Psychiatric:         Mood and Affect: Mood normal.          MDM  Number of Diagnoses or Management Options  Acute pain of left knee:     Pt is alert and appears in pain. She is unable to flex or extend her left knee and is most tender over the lateral joint line. Lateral view of xray does not show any acute fracture or displacement, unable to get additional views due to patient pain. Dr. Dedra Skeens attempted to straighten the leg, which caused extreme pain for her. Consult with orthopedics done, they suspect a meniscal tear and displacement. Appointment set up for tomorrow with Dr. Jettie Booze. She is discharged home with short-term pain management. Unable to place knee immobilizer due to patient pain. She agrees with current treatment plan. All questions answered.        Procedures    I personally saw and examined the patient.  I have reviewed and agree with the MLP's findings, including all diagnostic interpretations, and plans as written.   I was present during the key portions of separately billed procedures.  Ms. Gama presents to the ER with left knee pain and unable to straighten her leg.  Concern for possible meniscal tear with flipped piece of cartilage.  I try to, after the  patient see morphine, to provide longitudinal traction and straight leg in hopes that the cartilage with up and she can straighten her leg.  This was unsuccessful.  I have spoken with orthopedic surgery who will see her tomorrow in follow-up.  Verl Bangs, MD

## 2019-01-20 ENCOUNTER — Inpatient Hospital Stay: Admit: 2019-01-20 | Payer: BLUE CROSS/BLUE SHIELD | Primary: Internal Medicine

## 2019-01-20 ENCOUNTER — Encounter

## 2019-01-20 DIAGNOSIS — S83252A Bucket-handle tear of lateral meniscus, current injury, left knee, initial encounter: Secondary | ICD-10-CM

## 2023-08-25 ENCOUNTER — Encounter
